# Patient Record
Sex: Female | Born: 1955
Health system: Southern US, Community
[De-identification: ages and names within clinical notes are randomized; demographics above are authoritative.]

## PROBLEM LIST (undated history)

## (undated) DIAGNOSIS — M1990 Unspecified osteoarthritis, unspecified site: Secondary | ICD-10-CM

## (undated) DIAGNOSIS — R7303 Prediabetes: Secondary | ICD-10-CM

## (undated) DIAGNOSIS — I10 Essential (primary) hypertension: Secondary | ICD-10-CM

## (undated) DIAGNOSIS — K219 Gastro-esophageal reflux disease without esophagitis: Secondary | ICD-10-CM

## (undated) DIAGNOSIS — L309 Dermatitis, unspecified: Secondary | ICD-10-CM

## (undated) DIAGNOSIS — E785 Hyperlipidemia, unspecified: Secondary | ICD-10-CM

## (undated) HISTORY — DX: Essential (primary) hypertension: I10

## (undated) HISTORY — DX: Dermatitis, unspecified: L30.9

## (undated) HISTORY — PX: APPENDECTOMY: SHX54

## (undated) HISTORY — DX: Gastro-esophageal reflux disease without esophagitis: K21.9

## (undated) HISTORY — PX: DILATION AND CURETTAGE OF UTERUS: SHX78

## (undated) HISTORY — DX: Hyperlipidemia, unspecified: E78.5

---

## 1999-08-12 HISTORY — PX: MYOMECTOMY: SHX85

## 2016-11-20 DIAGNOSIS — Z0000 Encounter for general adult medical examination without abnormal findings: Secondary | ICD-10-CM | POA: Diagnosis not present

## 2016-11-26 DIAGNOSIS — E7800 Pure hypercholesterolemia, unspecified: Secondary | ICD-10-CM | POA: Diagnosis not present

## 2016-11-26 DIAGNOSIS — L2089 Other atopic dermatitis: Secondary | ICD-10-CM | POA: Insufficient documentation

## 2016-11-26 DIAGNOSIS — E785 Hyperlipidemia, unspecified: Secondary | ICD-10-CM | POA: Insufficient documentation

## 2016-11-26 DIAGNOSIS — E1169 Type 2 diabetes mellitus with other specified complication: Secondary | ICD-10-CM | POA: Insufficient documentation

## 2016-11-26 DIAGNOSIS — E119 Type 2 diabetes mellitus without complications: Secondary | ICD-10-CM | POA: Diagnosis not present

## 2016-11-26 DIAGNOSIS — D509 Iron deficiency anemia, unspecified: Secondary | ICD-10-CM | POA: Diagnosis not present

## 2016-12-02 ENCOUNTER — Other Ambulatory Visit: Payer: Self-pay | Admitting: Family Medicine

## 2016-12-02 DIAGNOSIS — Z1231 Encounter for screening mammogram for malignant neoplasm of breast: Secondary | ICD-10-CM

## 2016-12-17 ENCOUNTER — Ambulatory Visit
Admission: RE | Admit: 2016-12-17 | Discharge: 2016-12-17 | Disposition: A | Discharge status: Home / Self Care | Payer: 59 | Source: Ambulatory Visit | Attending: Family Medicine | Admitting: Family Medicine

## 2016-12-17 ENCOUNTER — Encounter (HOSPITAL_COMMUNITY): Payer: Self-pay

## 2016-12-17 DIAGNOSIS — Z1231 Encounter for screening mammogram for malignant neoplasm of breast: Secondary | ICD-10-CM | POA: Diagnosis not present

## 2017-04-29 DIAGNOSIS — E119 Type 2 diabetes mellitus without complications: Secondary | ICD-10-CM | POA: Diagnosis not present

## 2017-04-29 DIAGNOSIS — E7800 Pure hypercholesterolemia, unspecified: Secondary | ICD-10-CM | POA: Diagnosis not present

## 2017-04-29 DIAGNOSIS — D509 Iron deficiency anemia, unspecified: Secondary | ICD-10-CM | POA: Diagnosis not present

## 2017-05-06 DIAGNOSIS — E119 Type 2 diabetes mellitus without complications: Secondary | ICD-10-CM | POA: Diagnosis not present

## 2017-05-06 DIAGNOSIS — E7800 Pure hypercholesterolemia, unspecified: Secondary | ICD-10-CM | POA: Diagnosis not present

## 2017-11-30 ENCOUNTER — Other Ambulatory Visit: Payer: Self-pay | Admitting: Family Medicine

## 2017-11-30 DIAGNOSIS — Z1231 Encounter for screening mammogram for malignant neoplasm of breast: Secondary | ICD-10-CM

## 2017-12-30 DIAGNOSIS — E119 Type 2 diabetes mellitus without complications: Secondary | ICD-10-CM | POA: Diagnosis not present

## 2017-12-30 DIAGNOSIS — E7800 Pure hypercholesterolemia, unspecified: Secondary | ICD-10-CM | POA: Diagnosis not present

## 2017-12-30 DIAGNOSIS — D508 Other iron deficiency anemias: Secondary | ICD-10-CM | POA: Diagnosis not present

## 2018-01-06 DIAGNOSIS — K219 Gastro-esophageal reflux disease without esophagitis: Secondary | ICD-10-CM | POA: Insufficient documentation

## 2018-01-06 DIAGNOSIS — I10 Essential (primary) hypertension: Secondary | ICD-10-CM | POA: Insufficient documentation

## 2018-01-06 DIAGNOSIS — E119 Type 2 diabetes mellitus without complications: Secondary | ICD-10-CM | POA: Diagnosis not present

## 2018-01-06 DIAGNOSIS — D508 Other iron deficiency anemias: Secondary | ICD-10-CM | POA: Diagnosis not present

## 2018-02-09 ENCOUNTER — Ambulatory Visit
Admission: RE | Admit: 2018-02-09 | Discharge: 2018-02-09 | Disposition: A | Discharge status: Home / Self Care | Payer: 59 | Source: Ambulatory Visit | Attending: Family Medicine | Admitting: Family Medicine

## 2018-02-09 DIAGNOSIS — Z1231 Encounter for screening mammogram for malignant neoplasm of breast: Secondary | ICD-10-CM | POA: Insufficient documentation

## 2018-09-02 DIAGNOSIS — E7800 Pure hypercholesterolemia, unspecified: Secondary | ICD-10-CM | POA: Diagnosis not present

## 2018-09-02 DIAGNOSIS — D508 Other iron deficiency anemias: Secondary | ICD-10-CM | POA: Diagnosis not present

## 2018-09-02 DIAGNOSIS — E119 Type 2 diabetes mellitus without complications: Secondary | ICD-10-CM | POA: Diagnosis not present

## 2018-09-02 DIAGNOSIS — I10 Essential (primary) hypertension: Secondary | ICD-10-CM | POA: Diagnosis not present

## 2018-09-10 DIAGNOSIS — Z0000 Encounter for general adult medical examination without abnormal findings: Secondary | ICD-10-CM | POA: Diagnosis not present

## 2018-09-10 DIAGNOSIS — D508 Other iron deficiency anemias: Secondary | ICD-10-CM | POA: Diagnosis not present

## 2018-09-10 DIAGNOSIS — E1169 Type 2 diabetes mellitus with other specified complication: Secondary | ICD-10-CM | POA: Diagnosis not present

## 2018-09-20 ENCOUNTER — Other Ambulatory Visit: Payer: Self-pay | Admitting: Obstetrics and Gynecology

## 2018-09-20 DIAGNOSIS — Z124 Encounter for screening for malignant neoplasm of cervix: Secondary | ICD-10-CM | POA: Diagnosis not present

## 2018-09-20 DIAGNOSIS — Z01419 Encounter for gynecological examination (general) (routine) without abnormal findings: Secondary | ICD-10-CM | POA: Diagnosis not present

## 2018-09-20 DIAGNOSIS — Z1231 Encounter for screening mammogram for malignant neoplasm of breast: Secondary | ICD-10-CM

## 2018-09-20 DIAGNOSIS — Z1211 Encounter for screening for malignant neoplasm of colon: Secondary | ICD-10-CM | POA: Diagnosis not present

## 2018-09-27 DIAGNOSIS — Z1211 Encounter for screening for malignant neoplasm of colon: Secondary | ICD-10-CM | POA: Diagnosis not present

## 2018-10-05 DIAGNOSIS — K219 Gastro-esophageal reflux disease without esophagitis: Secondary | ICD-10-CM | POA: Diagnosis not present

## 2018-10-05 DIAGNOSIS — K921 Melena: Secondary | ICD-10-CM | POA: Diagnosis not present

## 2018-10-05 DIAGNOSIS — Z1211 Encounter for screening for malignant neoplasm of colon: Secondary | ICD-10-CM | POA: Diagnosis not present

## 2019-01-18 ENCOUNTER — Other Ambulatory Visit: Payer: Self-pay | Admitting: Surgery

## 2019-01-18 DIAGNOSIS — N611 Abscess of the breast and nipple: Secondary | ICD-10-CM

## 2019-01-18 DIAGNOSIS — Z1231 Encounter for screening mammogram for malignant neoplasm of breast: Secondary | ICD-10-CM

## 2019-01-20 ENCOUNTER — Ambulatory Visit
Admission: RE | Admit: 2019-01-20 | Discharge: 2019-01-20 | Disposition: A | Discharge status: Home / Self Care | Payer: 59 | Source: Ambulatory Visit | Attending: Surgery | Admitting: Surgery

## 2019-01-20 ENCOUNTER — Other Ambulatory Visit: Payer: Self-pay

## 2019-01-20 DIAGNOSIS — N611 Abscess of the breast and nipple: Secondary | ICD-10-CM

## 2019-01-20 DIAGNOSIS — Z1231 Encounter for screening mammogram for malignant neoplasm of breast: Secondary | ICD-10-CM | POA: Insufficient documentation

## 2019-01-24 ENCOUNTER — Other Ambulatory Visit: Payer: Self-pay | Admitting: Surgery

## 2019-01-24 DIAGNOSIS — N61 Mastitis without abscess: Secondary | ICD-10-CM

## 2019-02-15 ENCOUNTER — Ambulatory Visit
Admission: RE | Admit: 2019-02-15 | Discharge: 2019-02-15 | Disposition: A | Discharge status: Home / Self Care | Payer: 59 | Source: Ambulatory Visit | Attending: Surgery | Admitting: Surgery

## 2019-02-15 DIAGNOSIS — N61 Mastitis without abscess: Secondary | ICD-10-CM | POA: Diagnosis present

## 2019-09-12 ENCOUNTER — Other Ambulatory Visit: Payer: Self-pay | Admitting: Internal Medicine

## 2019-09-12 DIAGNOSIS — Z86018 Personal history of other benign neoplasm: Secondary | ICD-10-CM

## 2019-09-21 ENCOUNTER — Ambulatory Visit: Payer: 59

## 2019-09-30 ENCOUNTER — Other Ambulatory Visit: Payer: Self-pay

## 2019-09-30 ENCOUNTER — Ambulatory Visit
Admission: RE | Admit: 2019-09-30 | Discharge: 2019-09-30 | Disposition: A | Discharge status: Home / Self Care | Payer: 59 | Source: Ambulatory Visit | Attending: Internal Medicine | Admitting: Internal Medicine

## 2019-09-30 DIAGNOSIS — Z86018 Personal history of other benign neoplasm: Secondary | ICD-10-CM | POA: Diagnosis not present

## 2019-09-30 LAB — POCT I-STAT CREATININE: Creatinine, Ser: 0.8 mg/dL (ref 0.44–1.00)

## 2019-09-30 MED ORDER — IOHEXOL 300 MG/ML  SOLN
100.0000 mL | Freq: Once | INTRAMUSCULAR | Status: AC | PRN
  Administered 2019-09-30: 100 mL via INTRAVENOUS

## 2019-10-06 DIAGNOSIS — D175 Benign lipomatous neoplasm of intra-abdominal organs: Secondary | ICD-10-CM | POA: Insufficient documentation

## 2020-03-19 ENCOUNTER — Other Ambulatory Visit: Payer: Self-pay | Admitting: Family Medicine

## 2020-03-19 DIAGNOSIS — Z1231 Encounter for screening mammogram for malignant neoplasm of breast: Secondary | ICD-10-CM

## 2020-04-10 ENCOUNTER — Other Ambulatory Visit: Payer: Self-pay

## 2020-04-10 ENCOUNTER — Ambulatory Visit
Admission: RE | Admit: 2020-04-10 | Discharge: 2020-04-10 | Disposition: A | Discharge status: Home / Self Care | Payer: 59 | Source: Ambulatory Visit | Attending: Family Medicine | Admitting: Family Medicine

## 2020-04-10 DIAGNOSIS — Z1231 Encounter for screening mammogram for malignant neoplasm of breast: Secondary | ICD-10-CM

## 2020-04-12 ENCOUNTER — Other Ambulatory Visit: Payer: Self-pay | Admitting: Family Medicine

## 2020-04-12 DIAGNOSIS — R928 Other abnormal and inconclusive findings on diagnostic imaging of breast: Secondary | ICD-10-CM

## 2020-04-12 DIAGNOSIS — N6310 Unspecified lump in the right breast, unspecified quadrant: Secondary | ICD-10-CM

## 2020-04-27 ENCOUNTER — Ambulatory Visit
Admission: RE | Admit: 2020-04-27 | Discharge: 2020-04-27 | Disposition: A | Discharge status: Home / Self Care | Payer: 59 | Source: Ambulatory Visit | Attending: Family Medicine | Admitting: Family Medicine

## 2020-04-27 DIAGNOSIS — N6310 Unspecified lump in the right breast, unspecified quadrant: Secondary | ICD-10-CM | POA: Diagnosis present

## 2020-04-27 DIAGNOSIS — R928 Other abnormal and inconclusive findings on diagnostic imaging of breast: Secondary | ICD-10-CM | POA: Diagnosis not present

## 2020-05-02 ENCOUNTER — Other Ambulatory Visit: Payer: Self-pay | Admitting: Family Medicine

## 2020-05-02 DIAGNOSIS — R928 Other abnormal and inconclusive findings on diagnostic imaging of breast: Secondary | ICD-10-CM

## 2020-05-02 DIAGNOSIS — N6310 Unspecified lump in the right breast, unspecified quadrant: Secondary | ICD-10-CM

## 2020-05-04 ENCOUNTER — Other Ambulatory Visit: Payer: Self-pay

## 2020-05-04 ENCOUNTER — Ambulatory Visit
Admission: RE | Admit: 2020-05-04 | Discharge: 2020-05-04 | Disposition: A | Discharge status: Home / Self Care | Payer: 59 | Source: Ambulatory Visit | Attending: Family Medicine | Admitting: Family Medicine

## 2020-05-04 DIAGNOSIS — R928 Other abnormal and inconclusive findings on diagnostic imaging of breast: Secondary | ICD-10-CM | POA: Diagnosis not present

## 2020-05-04 DIAGNOSIS — N6310 Unspecified lump in the right breast, unspecified quadrant: Secondary | ICD-10-CM | POA: Diagnosis present

## 2020-05-08 ENCOUNTER — Telehealth: Payer: Self-pay

## 2020-05-08 HISTORY — PX: BREAST BIOPSY: SHX20

## 2020-05-08 LAB — SURGICAL PATHOLOGY

## 2020-05-08 NOTE — Telephone Encounter (Signed)
Patient has an appt with Dr. Hampton Abbot on Oct 15 at 11:30.  Patient is aware of time/date.

## 2020-05-25 ENCOUNTER — Telehealth: Payer: Self-pay

## 2020-05-25 ENCOUNTER — Encounter: Payer: Self-pay | Admitting: Surgery

## 2020-05-25 ENCOUNTER — Ambulatory Visit (INDEPENDENT_AMBULATORY_CARE_PROVIDER_SITE_OTHER): Payer: 59 | Admitting: Surgery

## 2020-05-25 ENCOUNTER — Other Ambulatory Visit: Payer: Self-pay

## 2020-05-25 DIAGNOSIS — Z8619 Personal history of other infectious and parasitic diseases: Secondary | ICD-10-CM | POA: Insufficient documentation

## 2020-05-25 DIAGNOSIS — D241 Benign neoplasm of right breast: Secondary | ICD-10-CM

## 2020-05-25 NOTE — Patient Instructions (Addendum)
We have spoken today about removing a lump in your breast. This will be done by Dr. Hampton Abbot at [facility].  See your blue surgery sheet. Our surgery scheduler will call you to look at surgery date and go over information.    You will most likely be able to leave the hospital several hours after your surgery. Rarely, a patient needs to stay over night but this is a possibility. You will need a driver to go home from surgery.   Plan to tenatively be off work for 1-2 weeks following the surgery and may return with approximately 4 more weeks of a lifting restriction, no greater than 15 lbs on the affected side.  We will send for Medical Clearance.     Lumpectomy A lumpectomy is a form of "breast conserving" or "breast preservation" surgery. It may also be referred to as a partial mastectomy. During a lumpectomy, the portion of the breast that contains the cancerous tumor or breast mass (the lump) is removed. Some normal tissue around the lump may also be removed to make sure all of the tumor has been removed.  LET [facility] CARE PROVIDER KNOW ABOUT:  Any allergies you have.  All medicines you are taking, including vitamins, herbs, eye drops, creams, and over-the-counter medicines.  Previous problems you or members of your family have had with the use of anesthetics.  Any blood disorders you have.  Previous surgeries you have had.  Medical conditions you have. RISKS AND COMPLICATIONS Generally, this is a safe procedure. However, problems can occur and include:  Bleeding.  Infection.  Pain.  Temporary swelling.  Change in the shape of the breast, particularly if a large portion is removed. BEFORE THE PROCEDURE  Ask your health care provider about changing or stopping your regular medicines. This is especially important if you are taking diabetes medicines or blood thinners.  Do not eat or drink anything after midnight on the night before the procedure or as directed by your health  care provider. Ask your health care provider if you can take a sip of water with any approved medicines.  On the day of surgery, your health care provider will use a mammogram or ultrasound to locate and mark the tumor in your breast. These markings on your breast will show where the cut (incision) will be made. PROCEDURE   An IV tube will be put into one of your veins.  You may be given medicine to help you relax before the surgery (sedative). You will be given one of the following:  A medicine that numbs the area (local anesthetic).  A medicine that makes you fall asleep (general anesthetic).  Your health care provider will use a kind of electric scalpel that uses heat to minimize bleeding (electrocautery knife).  A curved incision (like a smile or frown) that follows the natural curve of your breast is made, to allow for minimal scarring and better healing.  The tumor will be removed with some of the surrounding tissue. This will be sent to the lab for analysis. Your health care provider may also remove your lymph nodes at this time if needed.  Sometimes, but not always, a rubber tube called a drain will be surgically inserted into your breast area or armpit to collect excess fluid that may accumulate in the space where the tumor was. This drain is connected to a plastic bulb on the outside of your body. This drain creates suction to help remove the fluid.  The incisions will  be closed with stitches (sutures).  A bandage may be placed over the incisions. AFTER THE PROCEDURE  You will be taken to the recovery area.  You will be given medicine for pain.  A small rubber drain may be placed in the breast for 2-3 days to prevent a collection of blood (hematoma) from developing in the breast. You will be given instructions on caring for the drain before you go home.  A pressure bandage (dressing) will be applied for 1-2 days to prevent bleeding. Ask your health care provider how to care  for your bandage at home.   This information is not intended to replace advice given to you by your health care provider. Make sure you discuss any questions you have with your health care provider.   Document Released: 09/08/2006 Document Revised: 08/18/2014 Document Reviewed: 12/31/2012 Elsevier Interactive Patient Education [employer].

## 2020-05-25 NOTE — Telephone Encounter (Signed)
Medical Clearance faxed to [facility] at this time.

## 2020-05-25 NOTE — H&P (View-Only) (Signed)
05/25/2020  Reason for Visit:  Right breast intraductal papilloma  Referring Provider:  [place]  History of Present Illness: Jasmine Little is a 64 y.o. female presenting for evaluation of a right breast intraductal papilloma.  This was found on screening mammogram on 04/10/20.  She has a history of prior right breast mastitis, treated with antibiotics.  She had a diagnostic mammogram on 9/17 followed by biopsy on 9/24.  I have viewed the patient's images and agree with the findings.  Her biopsy results showed fragments of an intraductal papilloma without any evidence of atypical proliferation or malignancy.  She reports having some mild bruising after the biopsy, but is currently resolved and doing well.  Denies any palpable masses, skin changes, or nipple changes or drainage.    She started menses at age 64, had one pregnancy at age 63 which resulted in miscarriage, and had menopause at age 53.  There is no family history of breast cancer.  Past Medical History: Past Medical History:  Diagnosis Date  . Dermatitis   . GERD (gastroesophageal reflux disease)   . Hyperlipidemia   . Hypertension      Past Surgical History: Past Surgical History:  Procedure Laterality Date  . APPENDECTOMY  age 64  . DILATION AND CURETTAGE OF UTERUS  age 67    Home Medications: Prior to Admission medications   Medication Sig Start Date End Date Taking? Authorizing Provider  hydrochlorothiazide (HYDRODIURIL) 12.5 MG tablet Take 12.5 mg by mouth daily. 03/19/20  Yes [provider]  ketoconazole (NIZORAL) 2 % cream Apply topically as needed. 04/17/20  Yes [provider]  lisinopril (ZESTRIL) 40 MG tablet Take 1 tablet by mouth daily. 05/23/20  Yes [provider]  lovastatin (MEVACOR) 40 MG tablet Take 1 tablet by mouth daily.   Yes [provider]  omeprazole (PRILOSEC) 20 MG capsule Take by mouth daily. 03/19/20 03/19/21 Yes [provider]   tacrolimus (PROTOPIC) 0.1 % ointment as needed. 09/27/19  Yes [provider]  triamcinolone cream (KENALOG) 0.5 % Apply topically 2 (two) times daily. 05/02/20  Yes [provider]  vitamin E (VITAMIN E) 180 MG (400 UNITS) capsule Take 400 Units by mouth daily.   Yes [provider]    Allergies: No Known Allergies  Social History:  reports that she quit smoking about 17 years ago. Her smoking use included cigarettes. She smoked 0.25 packs per day. She has never used smokeless tobacco. She reports current alcohol use. She reports that she does not use drugs.   Family History: Family History  Problem Relation Age of Onset  . Breast cancer Neg Hx     Review of Systems: Review of Systems  Constitutional: Negative for chills and fever.  HENT: Negative for hearing loss.   Eyes: Negative for blurred vision.  Respiratory: Negative for shortness of breath.   Cardiovascular: Negative for chest pain.  Gastrointestinal: Negative for abdominal pain, nausea and vomiting.  Genitourinary: Negative for dysuria.  Musculoskeletal: Negative for myalgias.  Skin: Negative for rash.  Neurological: Negative for dizziness.  Psychiatric/Behavioral: Negative for depression.    Physical Exam There were no vitals taken for this visit. CONSTITUTIONAL: No acute distress. HEENT:  Normocephalic, atraumatic, extraocular motion intact. NECK: Trachea is midline, and there is no jugular venous distension.  RESPIRATORY:  Lungs are clear, and breath sounds are equal bilaterally. Normal respiratory effort without pathologic use of accessory muscles. CARDIOVASCULAR: Heart is regular without murmurs, gallops, or rubs. BREAST:  Right  breast with biopsy scar just medial to the areola, healing well.  No palpable masses, skin changes, or nipple changes.  No right axillary or supraclavicular lymphadenopathy.  Left breast without any palpable masses, skin changes, or nipple changes.  No left  axillary or supraclavicular lymphadenopathy. GI: The abdomen is soft, obese, non-distended.  MUSCULOSKELETAL:  Normal muscle strength and tone in all four extremities.  No peripheral edema or cyanosis. SKIN: Skin turgor is normal. There are no pathologic skin lesions.  NEUROLOGIC:  Motor and sensation is grossly normal.  Cranial nerves are grossly intact. PSYCH:  Alert and oriented to person, place and time. Affect is normal.  Laboratory Analysis: Pathology: DIAGNOSIS:  A. BREAST, RIGHT RETROAREOLAR; ULTRASOUND-GUIDED CORE NEEDLE BIOPSY:  - FRAGMENTS OF BENIGN INTRADUCTAL PAPILLOMA WITH FOCAL DENSE SCLEROSIS.  - NO EVIDENCE OF ATYPICAL PROLIFERATIVE BREAST DISEASE.   Imaging: Mammogram and U/S 04/27/20: FINDINGS: Additional imaging demonstrates a tubular mass emanating from behind the right nipple with associated rounded masses, potentially representing a dilated duct with associated masses.  Mammographic images were processed with CAD.  Targeted ultrasound is performed, showing a dilated duct within the retroareolar right breast which bifurcates into two intraductal masses. The first mass measures approximately 1.3 x 0.8 x 0.8 cm and the second mass measures approximately 0.6 x 0.5 x 0.8 cm.  IMPRESSION: Intraductal masses within a dilated duct in the retroareolar right breast.   Assessment and Plan: This is a 64 y.o. female with a right breast intraductal papilloma.  --Discussed imaging findings with the patient and reviewed pathology results.  Overall there is no evidence of malignancy.  Although this is a benign finding, there is a small risk of upgrading diagnosis after resection, hence the recommendation of excision.  The patient is most definitely interested in surgery.  Discussed with her the role of radiofrequency tag placement in order to help with localization of the mass intraoperatively.  Then we would proceed with lumpectomy using the tag as a localizer.  Discussed  risks of bleeding, infection, injury to surrounding structures, and she's willing to proceed. She understands she would need COVID test prior to surgery.  We'll also send medical clearance to her PCP. --We'll schedule her for 06/07/20.  Face-to-face time spent with the patient and care providers was 60 minutes, with more than 50% of the time spent counseling, educating, and coordinating care of the patient.     Melvyn Neth, [facility]

## 2020-05-25 NOTE — Progress Notes (Signed)
05/25/2020  Reason for Visit:  Right breast intraductal papilloma  Referring Provider:  [place]  History of Present Illness: Jasmine Little is a 64 y.o. female presenting for evaluation of a right breast intraductal papilloma.  This was found on screening mammogram on 04/10/20.  She has a history of prior right breast mastitis, treated with antibiotics.  She had a diagnostic mammogram on 9/17 followed by biopsy on 9/24.  I have viewed the patient's images and agree with the findings.  Her biopsy results showed fragments of an intraductal papilloma without any evidence of atypical proliferation or malignancy.  She reports having some mild bruising after the biopsy, but is currently resolved and doing well.  Denies any palpable masses, skin changes, or nipple changes or drainage.    She started menses at age 78, had one pregnancy at age 27 which resulted in miscarriage, and had menopause at age 63.  There is no family history of breast cancer.  Past Medical History: Past Medical History:  Diagnosis Date  . Dermatitis   . GERD (gastroesophageal reflux disease)   . Hyperlipidemia   . Hypertension      Past Surgical History: Past Surgical History:  Procedure Laterality Date  . APPENDECTOMY  age 46  . DILATION AND CURETTAGE OF UTERUS  age 89    Home Medications: Prior to Admission medications   Medication Sig Start Date End Date Taking? Authorizing Provider  hydrochlorothiazide (HYDRODIURIL) 12.5 MG tablet Take 12.5 mg by mouth daily. 03/19/20  Yes [provider]  ketoconazole (NIZORAL) 2 % cream Apply topically as needed. 04/17/20  Yes [provider]  lisinopril (ZESTRIL) 40 MG tablet Take 1 tablet by mouth daily. 05/23/20  Yes [provider]  lovastatin (MEVACOR) 40 MG tablet Take 1 tablet by mouth daily.   Yes [provider]  omeprazole (PRILOSEC) 20 MG capsule Take by mouth daily. 03/19/20 03/19/21 Yes [provider]   tacrolimus (PROTOPIC) 0.1 % ointment as needed. 09/27/19  Yes [provider]  triamcinolone cream (KENALOG) 0.5 % Apply topically 2 (two) times daily. 05/02/20  Yes [provider]  vitamin E (VITAMIN E) 180 MG (400 UNITS) capsule Take 400 Units by mouth daily.   Yes [provider]    Allergies: No Known Allergies  Social History:  reports that she quit smoking about 17 years ago. Her smoking use included cigarettes. She smoked 0.25 packs per day. She has never used smokeless tobacco. She reports current alcohol use. She reports that she does not use drugs.   Family History: Family History  Problem Relation Age of Onset  . Breast cancer Neg Hx     Review of Systems: Review of Systems  Constitutional: Negative for chills and fever.  HENT: Negative for hearing loss.   Eyes: Negative for blurred vision.  Respiratory: Negative for shortness of breath.   Cardiovascular: Negative for chest pain.  Gastrointestinal: Negative for abdominal pain, nausea and vomiting.  Genitourinary: Negative for dysuria.  Musculoskeletal: Negative for myalgias.  Skin: Negative for rash.  Neurological: Negative for dizziness.  Psychiatric/Behavioral: Negative for depression.    Physical Exam There were no vitals taken for this visit. CONSTITUTIONAL: No acute distress. HEENT:  Normocephalic, atraumatic, extraocular motion intact. NECK: Trachea is midline, and there is no jugular venous distension.  RESPIRATORY:  Lungs are clear, and breath sounds are equal bilaterally. Normal respiratory effort without pathologic use of accessory muscles. CARDIOVASCULAR: Heart is regular without murmurs, gallops, or rubs. BREAST:  Right  breast with biopsy scar just medial to the areola, healing well.  No palpable masses, skin changes, or nipple changes.  No right axillary or supraclavicular lymphadenopathy.  Left breast without any palpable masses, skin changes, or nipple changes.  No left  axillary or supraclavicular lymphadenopathy. GI: The abdomen is soft, obese, non-distended.  MUSCULOSKELETAL:  Normal muscle strength and tone in all four extremities.  No peripheral edema or cyanosis. SKIN: Skin turgor is normal. There are no pathologic skin lesions.  NEUROLOGIC:  Motor and sensation is grossly normal.  Cranial nerves are grossly intact. PSYCH:  Alert and oriented to person, place and time. Affect is normal.  Laboratory Analysis: Pathology: DIAGNOSIS:  A. BREAST, RIGHT RETROAREOLAR; ULTRASOUND-GUIDED CORE NEEDLE BIOPSY:  - FRAGMENTS OF BENIGN INTRADUCTAL PAPILLOMA WITH FOCAL DENSE SCLEROSIS.  - NO EVIDENCE OF ATYPICAL PROLIFERATIVE BREAST DISEASE.   Imaging: Mammogram and U/S 04/27/20: FINDINGS: Additional imaging demonstrates a tubular mass emanating from behind the right nipple with associated rounded masses, potentially representing a dilated duct with associated masses.  Mammographic images were processed with CAD.  Targeted ultrasound is performed, showing a dilated duct within the retroareolar right breast which bifurcates into two intraductal masses. The first mass measures approximately 1.3 x 0.8 x 0.8 cm and the second mass measures approximately 0.6 x 0.5 x 0.8 cm.  IMPRESSION: Intraductal masses within a dilated duct in the retroareolar right breast.   Assessment and Plan: This is a 64 y.o. female with a right breast intraductal papilloma.  --Discussed imaging findings with the patient and reviewed pathology results.  Overall there is no evidence of malignancy.  Although this is a benign finding, there is a small risk of upgrading diagnosis after resection, hence the recommendation of excision.  The patient is most definitely interested in surgery.  Discussed with her the role of radiofrequency tag placement in order to help with localization of the mass intraoperatively.  Then we would proceed with lumpectomy using the tag as a localizer.  Discussed  risks of bleeding, infection, injury to surrounding structures, and she's willing to proceed. She understands she would need COVID test prior to surgery.  We'll also send medical clearance to her PCP. --We'll schedule her for 06/07/20.  Face-to-face time spent with the patient and care providers was 60 minutes, with more than 50% of the time spent counseling, educating, and coordinating care of the patient.     Melvyn Neth, [facility]

## 2020-05-28 ENCOUNTER — Other Ambulatory Visit: Payer: Self-pay | Admitting: Surgery

## 2020-05-28 DIAGNOSIS — D242 Benign neoplasm of left breast: Secondary | ICD-10-CM

## 2020-05-28 NOTE — Telephone Encounter (Signed)
Medical Clearance hand delivered to Dr.Linthavongs office due to faxing failed multiple times. Gave to April receptionist at front desk.

## 2020-05-30 ENCOUNTER — Telehealth: Payer: Self-pay | Admitting: Surgery

## 2020-05-30 NOTE — Telephone Encounter (Signed)
Patient has been advised of Pre-Admission date/time, COVID Testing date and Surgery date.  Surgery Date: 06/07/20 Preadmission Testing Date: 06/01/20 (phone 1p-5p) Covid Testing Date: 06/05/20 - patient advised to go to the [facility] ([place]) between 8a-1p  Patient has been made aware to call [phone], between 1-3:00pm the day before surgery, to find out what time to arrive for surgery.    Patient also has been informed by [facility] regarding her RF tag scheduled for 06/04/20.

## 2020-06-01 ENCOUNTER — Other Ambulatory Visit: Payer: Self-pay

## 2020-06-01 ENCOUNTER — Encounter
Admission: RE | Admit: 2020-06-01 | Discharge: 2020-06-01 | Disposition: A | Discharge status: Home / Self Care | Payer: 59 | Source: Ambulatory Visit | Attending: Surgery | Admitting: Surgery

## 2020-06-01 HISTORY — DX: Prediabetes: R73.03

## 2020-06-01 HISTORY — DX: Unspecified osteoarthritis, unspecified site: M19.90

## 2020-06-01 NOTE — Patient Instructions (Signed)
Your procedure is scheduled on: Thursday June 07, 2020. Report to Day Surgery inside [facility] 2nd floor . To find out your arrival time please call [phone] between 1PM - 3PM on Wednesday June 06, 2020.  Remember: Instructions that are not followed completely may result in serious medical risk,  up to and including death, or upon the discretion of your surgeon and anesthesiologist your  surgery may need to be rescheduled.     _X__ 1. Do not eat food after midnight the night before your procedure.                 No chewing gum or hard candies. You may drink clear liquids up to 2 hours                 before you are scheduled to arrive for your surgery- DO not drink clear                 liquids within 2 hours of the start of your surgery.                 Clear Liquids include:  water, apple juice without pulp, clear Gatorade, G2 or                  Gatorade Zero (avoid Red/Purple/Blue), Black Coffee or Tea (Do not add                 anything to coffee or tea).  __X__2.  On the morning of surgery brush your teeth with toothpaste and water, you                may rinse your mouth with mouthwash if you wish.  Do not swallow any toothpaste of mouthwash.     _X__ 3.  No Alcohol for 24 hours before or after surgery.   _X__ 4.  Do Not Smoke or use e-cigarettes For 24 Hours Prior to Your Surgery.                 Do not use any chewable tobacco products for at least 6 hours prior to                 Surgery.  _X__  5.  Do not use any recreational drugs (marijuana, cocaine, heroin, ecstasy, MDMA or other)                For at least one week prior to your surgery.  Combination of these drugs with anesthesia                May have life threatening results.  __X__ 6.  Notify your doctor if there is any change in your medical condition      (cold, fever, infections).     Do not wear jewelry, make-up, hairpins, clips or nail polish. Do not wear lotions,  powders, or perfumes. You may wear deodorant. Do not shave 48 hours prior to surgery. Men may shave face and neck. Do not bring valuables to the hospital.    [facility] is not responsible for any belongings or valuables.  Contacts, dentures or bridgework may not be worn into surgery. Leave your suitcase in the car. After surgery it may be brought to your room. For patients admitted to the hospital, discharge time is determined by your treatment team.   Patients discharged the day of surgery will not be allowed to drive home.   Make arrangements for someone to  be with you for the first 24 hours of your Same Day Discharge.   __x__ Take these medicines the morning of surgery with A SIP OF WATER:    1. omeprazole (PRILOSEC) 20 MG  __X__ Use CHG Soap (or wipes) as directed  ____ Use Benzoyl Peroxide Gel as instructed  ____ Use inhalers on the day of surgery  ____ Stop metformin 2 days prior to surgery    ____ Take 1/2 of usual insulin dose the night before surgery. No insulin the morning          of surgery.   ____ Stop Coumadin/Plavix/aspirin on   __x__ Stop Anti-inflammatories such as Ibuprofen, Aleve, Advil, naproxen, aspirin and or BC powders.    __x__ Stop supplements until after surgery.    __x__ Bring C-Pap to the hospital.    If you have any questions regarding your pre-procedure instructions,  Please call Pre-admit Testing at [phone].

## 2020-06-04 ENCOUNTER — Ambulatory Visit
Admission: RE | Admit: 2020-06-04 | Discharge: 2020-06-04 | Disposition: A | Discharge status: Home / Self Care | Payer: 59 | Source: Ambulatory Visit | Attending: Surgery | Admitting: Surgery

## 2020-06-04 ENCOUNTER — Other Ambulatory Visit: Payer: Self-pay

## 2020-06-04 DIAGNOSIS — D242 Benign neoplasm of left breast: Secondary | ICD-10-CM | POA: Insufficient documentation

## 2020-06-05 ENCOUNTER — Other Ambulatory Visit
Admission: RE | Admit: 2020-06-05 | Discharge: 2020-06-05 | Disposition: A | Discharge status: Home / Self Care | Payer: 59 | Source: Ambulatory Visit | Attending: Surgery | Admitting: Surgery

## 2020-06-05 DIAGNOSIS — Z20822 Contact with and (suspected) exposure to covid-19: Secondary | ICD-10-CM | POA: Insufficient documentation

## 2020-06-05 DIAGNOSIS — Z01812 Encounter for preprocedural laboratory examination: Secondary | ICD-10-CM | POA: Insufficient documentation

## 2020-06-05 LAB — SARS CORONAVIRUS 2 (TAT 6-24 HRS): SARS Coronavirus 2: NEGATIVE

## 2020-06-05 NOTE — Progress Notes (Signed)
Medical Clearance has been received from Dr Netty Starring. The patient is cleared at Low risk for surgery.

## 2020-06-07 ENCOUNTER — Ambulatory Visit: Payer: 59 | Admitting: Urgent Care

## 2020-06-07 ENCOUNTER — Other Ambulatory Visit: Payer: Self-pay

## 2020-06-07 ENCOUNTER — Ambulatory Visit
Admission: RE | Admit: 2020-06-07 | Discharge: 2020-06-07 | Disposition: A | Discharge status: Home / Self Care | Payer: 59 | Source: Ambulatory Visit | Attending: Surgery | Admitting: Surgery

## 2020-06-07 ENCOUNTER — Ambulatory Visit
Admission: RE | Admit: 2020-06-07 | Discharge: 2020-06-07 | Disposition: A | Discharge status: Home / Self Care | Payer: 59 | Attending: Surgery | Admitting: Surgery

## 2020-06-07 ENCOUNTER — Encounter
Admission: RE | Disposition: A | Discharge status: Home / Self Care | Payer: Self-pay | Source: Home / Self Care | Attending: Surgery

## 2020-06-07 ENCOUNTER — Encounter: Payer: Self-pay | Admitting: Surgery

## 2020-06-07 DIAGNOSIS — D241 Benign neoplasm of right breast: Secondary | ICD-10-CM

## 2020-06-07 DIAGNOSIS — D242 Benign neoplasm of left breast: Secondary | ICD-10-CM

## 2020-06-07 DIAGNOSIS — Z87891 Personal history of nicotine dependence: Secondary | ICD-10-CM | POA: Diagnosis not present

## 2020-06-07 DIAGNOSIS — N6041 Mammary duct ectasia of right breast: Secondary | ICD-10-CM | POA: Insufficient documentation

## 2020-06-07 HISTORY — PX: BREAST LUMPECTOMY WITH RADIOFREQUENCY TAG IDENTIFICATION: SHX6884

## 2020-06-07 LAB — URINE DRUG SCREEN, QUALITATIVE (ARMC ONLY)
Amphetamines, Ur Screen: NOT DETECTED
Barbiturates, Ur Screen: NOT DETECTED
Benzodiazepine, Ur Scrn: NOT DETECTED
Cannabinoid 50 Ng, Ur ~~LOC~~: POSITIVE — AB
Cocaine Metabolite,Ur ~~LOC~~: NOT DETECTED
MDMA (Ecstasy)Ur Screen: NOT DETECTED
Methadone Scn, Ur: NOT DETECTED
Opiate, Ur Screen: NOT DETECTED
Phencyclidine (PCP) Ur S: NOT DETECTED
Tricyclic, Ur Screen: NOT DETECTED

## 2020-06-07 LAB — CBC
HCT: 31.7 % — ABNORMAL LOW (ref 36.0–46.0)
Hemoglobin: 10.1 g/dL — ABNORMAL LOW (ref 12.0–15.0)
MCH: 24.9 pg — ABNORMAL LOW (ref 26.0–34.0)
MCHC: 31.9 g/dL (ref 30.0–36.0)
MCV: 78.1 fL — ABNORMAL LOW (ref 80.0–100.0)
Platelets: 326 10*3/uL (ref 150–400)
RBC: 4.06 MIL/uL (ref 3.87–5.11)
RDW: 19 % — ABNORMAL HIGH (ref 11.5–15.5)
WBC: 9 10*3/uL (ref 4.0–10.5)
nRBC: 0 % (ref 0.0–0.2)

## 2020-06-07 SURGERY — BREAST LUMPECTOMY WITH RADIOFREQUENCY TAG IDENTIFICATION
Anesthesia: General | Site: Breast | Laterality: Right

## 2020-06-07 MED ORDER — BUPIVACAINE-EPINEPHRINE 0.5% -1:200000 IJ SOLN
INTRAMUSCULAR | Status: DC | PRN
  Administered 2020-06-07: 30 mL

## 2020-06-07 MED ORDER — ORAL CARE MOUTH RINSE: 15.0000 mL | Freq: Once | OROMUCOSAL | Status: AC

## 2020-06-07 MED ORDER — ONDANSETRON HCL 4 MG/2ML IJ SOLN: 4.0000 mg | Freq: Once | INTRAMUSCULAR | Status: DC | PRN

## 2020-06-07 MED ORDER — FENTANYL CITRATE (PF) 100 MCG/2ML IJ SOLN
INTRAMUSCULAR | Status: AC
  Filled 2020-06-07: qty 2

## 2020-06-07 MED ORDER — DEXAMETHASONE SODIUM PHOSPHATE 10 MG/ML IJ SOLN
INTRAMUSCULAR | Status: AC
  Filled 2020-06-07: qty 1

## 2020-06-07 MED ORDER — EPINEPHRINE PF 1 MG/ML IJ SOLN
INTRAMUSCULAR | Status: AC
  Filled 2020-06-07: qty 1

## 2020-06-07 MED ORDER — LIDOCAINE HCL (CARDIAC) PF 100 MG/5ML IV SOSY
PREFILLED_SYRINGE | INTRAVENOUS | Status: DC | PRN
  Administered 2020-06-07: 100 mg via INTRAVENOUS

## 2020-06-07 MED ORDER — BUPIVACAINE HCL (PF) 0.5 % IJ SOLN
INTRAMUSCULAR | Status: AC
  Filled 2020-06-07: qty 30

## 2020-06-07 MED ORDER — IBUPROFEN 600 MG PO TABS: 600.0000 mg | ORAL_TABLET | Freq: Three times a day (TID) | ORAL | 0 refills | Status: DC | PRN

## 2020-06-07 MED ORDER — ROCURONIUM BROMIDE 100 MG/10ML IV SOLN
INTRAVENOUS | Status: DC | PRN
  Administered 2020-06-07: 20 mg via INTRAVENOUS
  Administered 2020-06-07: 10 mg via INTRAVENOUS
  Administered 2020-06-07: 40 mg via INTRAVENOUS

## 2020-06-07 MED ORDER — GABAPENTIN 300 MG PO CAPS: 300.0000 mg | ORAL_CAPSULE | ORAL | Status: AC

## 2020-06-07 MED ORDER — ACETAMINOPHEN 500 MG PO TABS: 1000.0000 mg | ORAL_TABLET | ORAL | Status: AC

## 2020-06-07 MED ORDER — CEFAZOLIN SODIUM-DEXTROSE 2-4 GM/100ML-% IV SOLN
2.0000 g | INTRAVENOUS | Status: AC
  Administered 2020-06-07: 2 g via INTRAVENOUS

## 2020-06-07 MED ORDER — LACTATED RINGERS IV SOLN: INTRAVENOUS | Status: DC

## 2020-06-07 MED ORDER — CHLORHEXIDINE GLUCONATE 0.12 % MT SOLN: 15.0000 mL | Freq: Once | OROMUCOSAL | Status: AC

## 2020-06-07 MED ORDER — ONDANSETRON HCL 4 MG/2ML IJ SOLN
INTRAMUSCULAR | Status: AC
  Filled 2020-06-07: qty 2

## 2020-06-07 MED ORDER — SODIUM CHLORIDE (PF) 0.9 % IJ SOLN
INTRAMUSCULAR | Status: AC
  Filled 2020-06-07: qty 10

## 2020-06-07 MED ORDER — ACETAMINOPHEN 500 MG PO TABS
ORAL_TABLET | ORAL | Status: AC
  Administered 2020-06-07: 1000 mg via ORAL
  Filled 2020-06-07: qty 2

## 2020-06-07 MED ORDER — METHYLENE BLUE 0.5 % INJ SOLN
INTRAVENOUS | Status: AC
  Filled 2020-06-07: qty 10

## 2020-06-07 MED ORDER — DEXAMETHASONE SODIUM PHOSPHATE 10 MG/ML IJ SOLN
INTRAMUSCULAR | Status: DC | PRN
  Administered 2020-06-07: 10 mg via INTRAVENOUS

## 2020-06-07 MED ORDER — SUGAMMADEX SODIUM 200 MG/2ML IV SOLN
INTRAVENOUS | Status: DC | PRN
  Administered 2020-06-07: 200 mg via INTRAVENOUS

## 2020-06-07 MED ORDER — FENTANYL CITRATE (PF) 100 MCG/2ML IJ SOLN: 25.0000 ug | INTRAMUSCULAR | Status: DC | PRN

## 2020-06-07 MED ORDER — MIDAZOLAM HCL 2 MG/2ML IJ SOLN
INTRAMUSCULAR | Status: DC | PRN
  Administered 2020-06-07: 2 mg via INTRAVENOUS

## 2020-06-07 MED ORDER — ROCURONIUM BROMIDE 10 MG/ML (PF) SYRINGE
PREFILLED_SYRINGE | INTRAVENOUS | Status: AC
  Filled 2020-06-07: qty 10

## 2020-06-07 MED ORDER — GABAPENTIN 300 MG PO CAPS
ORAL_CAPSULE | ORAL | Status: AC
  Administered 2020-06-07: 300 mg via ORAL
  Filled 2020-06-07: qty 1

## 2020-06-07 MED ORDER — PROPOFOL 10 MG/ML IV BOLUS
INTRAVENOUS | Status: AC
  Filled 2020-06-07: qty 20

## 2020-06-07 MED ORDER — CHLORHEXIDINE GLUCONATE CLOTH 2 % EX PADS
6.0000 | MEDICATED_PAD | Freq: Once | CUTANEOUS | Status: AC
  Administered 2020-06-07: 6 via TOPICAL

## 2020-06-07 MED ORDER — PROPOFOL 10 MG/ML IV BOLUS
INTRAVENOUS | Status: DC | PRN
  Administered 2020-06-07: 160 mg via INTRAVENOUS

## 2020-06-07 MED ORDER — CHLORHEXIDINE GLUCONATE 0.12 % MT SOLN
OROMUCOSAL | Status: AC
  Administered 2020-06-07: 15 mL via OROMUCOSAL
  Filled 2020-06-07: qty 15

## 2020-06-07 MED ORDER — LIDOCAINE HCL (PF) 2 % IJ SOLN
INTRAMUSCULAR | Status: AC
  Filled 2020-06-07: qty 5

## 2020-06-07 MED ORDER — FENTANYL CITRATE (PF) 250 MCG/5ML IJ SOLN
INTRAMUSCULAR | Status: DC | PRN
  Administered 2020-06-07 (×4): 50 ug via INTRAVENOUS

## 2020-06-07 MED ORDER — CEFAZOLIN SODIUM-DEXTROSE 2-4 GM/100ML-% IV SOLN
INTRAVENOUS | Status: AC
  Filled 2020-06-07: qty 100

## 2020-06-07 MED ORDER — ACETAMINOPHEN 500 MG PO TABS: 1000.0000 mg | ORAL_TABLET | Freq: Four times a day (QID) | ORAL | Status: DC | PRN

## 2020-06-07 MED ORDER — PHENYLEPHRINE HCL (PRESSORS) 10 MG/ML IV SOLN
INTRAVENOUS | Status: DC | PRN
  Administered 2020-06-07 (×7): 100 ug via INTRAVENOUS

## 2020-06-07 MED ORDER — SUCCINYLCHOLINE CHLORIDE 20 MG/ML IJ SOLN
INTRAMUSCULAR | Status: DC | PRN
  Administered 2020-06-07: 140 mg via INTRAVENOUS

## 2020-06-07 MED ORDER — MIDAZOLAM HCL 2 MG/2ML IJ SOLN
INTRAMUSCULAR | Status: AC
  Filled 2020-06-07: qty 2

## 2020-06-07 MED ORDER — ONDANSETRON HCL 4 MG/2ML IJ SOLN
INTRAMUSCULAR | Status: DC | PRN
  Administered 2020-06-07: 4 mg via INTRAVENOUS

## 2020-06-07 SURGICAL SUPPLY — 54 items
ADH SKN CLS APL DERMABOND .7 (GAUZE/BANDAGES/DRESSINGS) ×1
APL PRP STRL LF DISP 70% ISPRP (MISCELLANEOUS) ×1
APPLIER CLIP 9.375 SM OPEN (CLIP)
APR CLP SM 9.3 20 MLT OPN (CLIP)
BINDER BREAST LRG (GAUZE/BANDAGES/DRESSINGS) IMPLANT
BINDER BREAST MEDIUM (GAUZE/BANDAGES/DRESSINGS) IMPLANT
BINDER BREAST XXLRG (GAUZE/BANDAGES/DRESSINGS) ×2 IMPLANT
BLADE PHOTON ILLUMINATED (MISCELLANEOUS) ×2 IMPLANT
BLADE SURG 15 STRL LF DISP TIS (BLADE) ×2 IMPLANT
BLADE SURG 15 STRL SS (BLADE) ×4
CANISTER SUCT 1200ML W/VALVE (MISCELLANEOUS) ×2 IMPLANT
CHLORAPREP W/TINT 26 (MISCELLANEOUS) ×2 IMPLANT
CLIP APPLIE 9.375 SM OPEN (CLIP) IMPLANT
CNTNR SPEC 2.5X3XGRAD LEK (MISCELLANEOUS) ×1
CONT SPEC 4OZ STER OR WHT (MISCELLANEOUS) ×1
CONT SPEC 4OZ STRL OR WHT (MISCELLANEOUS) ×1
CONTAINER SPEC 2.5X3XGRAD LEK (MISCELLANEOUS) ×1 IMPLANT
COVER PROBE FLX POLY STRL (MISCELLANEOUS) ×6 IMPLANT
COVER WAND RF STERILE (DRAPES) IMPLANT
DERMABOND ADVANCED (GAUZE/BANDAGES/DRESSINGS) ×1
DERMABOND ADVANCED .7 DNX12 (GAUZE/BANDAGES/DRESSINGS) ×1 IMPLANT
DEVICE DUBIN SPECIMEN MAMMOGRA (MISCELLANEOUS) ×2 IMPLANT
DRAPE LAPAROTOMY 100X77 ABD (DRAPES) ×2 IMPLANT
DRSG GAUZE FLUFF 36X18 (GAUZE/BANDAGES/DRESSINGS) ×2 IMPLANT
ELECT CAUTERY BLADE 6.4 (BLADE) ×2 IMPLANT
ELECT REM PT RETURN 9FT ADLT (ELECTROSURGICAL) ×2
ELECTRODE REM PT RTRN 9FT ADLT (ELECTROSURGICAL) ×1 IMPLANT
GLOVE SURG SYN 7.0 (GLOVE) ×2 IMPLANT
GLOVE SURG SYN 7.5  E (GLOVE) ×1
GLOVE SURG SYN 7.5 E (GLOVE) ×1 IMPLANT
GOWN STRL REUS W/ TWL LRG LVL3 (GOWN DISPOSABLE) ×2 IMPLANT
GOWN STRL REUS W/TWL LRG LVL3 (GOWN DISPOSABLE) ×4
KIT MARKER MARGIN INK (KITS) IMPLANT
KIT TURNOVER KIT A (KITS) ×2 IMPLANT
LABEL OR SOLS (LABEL) ×2 IMPLANT
MARGIN MAP 10MM (MISCELLANEOUS) IMPLANT
MARKER MARGIN CORRECT CLIP (MARKER) ×2 IMPLANT
NEEDLE HYPO 22GX1.5 SAFETY (NEEDLE) ×2 IMPLANT
NEEDLE HYPO 25X1 1.5 SAFETY (NEEDLE) ×2 IMPLANT
PACK BASIN MINOR (MISCELLANEOUS) ×2 IMPLANT
SET LOCALIZER 20 PROBE US (MISCELLANEOUS) ×2 IMPLANT
SLEVE PROBE SENORX GAMMA FIND (MISCELLANEOUS) IMPLANT
SUT ETHILON 3-0 FS-10 30 BLK (SUTURE)
SUT MNCRL 4-0 (SUTURE) ×2
SUT MNCRL 4-0 27XMFL (SUTURE) ×1
SUT SILK 3 0 SH 30 (SUTURE) ×2 IMPLANT
SUT VIC AB 3-0 SH 27 (SUTURE) ×2
SUT VIC AB 3-0 SH 27X BRD (SUTURE) ×1 IMPLANT
SUTURE EHLN 3-0 FS-10 30 BLK (SUTURE) IMPLANT
SUTURE MNCRL 4-0 27XMF (SUTURE) ×1 IMPLANT
SYR 10ML LL (SYRINGE) ×2 IMPLANT
SYR BULB IRRIG 60ML STRL (SYRINGE) ×2 IMPLANT
TAPE TRANSPORE STRL 2 31045 (GAUZE/BANDAGES/DRESSINGS) IMPLANT
WATER STERILE IRR 1000ML POUR (IV SOLUTION) ×2 IMPLANT

## 2020-06-07 NOTE — Transfer of Care (Signed)
Immediate Anesthesia Transfer of Care Note  Patient: Jasmine Little  Procedure(s) Performed: BREAST LUMPECTOMY WITH RADIOFREQUENCY TAG IDENTIFICATION (Right Breast)  Patient Location: PACU  Anesthesia Type:General  Level of Consciousness: awake, alert , oriented and patient cooperative  Airway & Oxygen Therapy: Patient Spontanous Breathing and Patient connected to face mask oxygen  Post-op Assessment: Report given to RN and Post -op Vital signs reviewed and stable  Post vital signs: Reviewed and stable  Last Vitals:  Vitals Value Taken Time  BP 151/106 06/07/20 0935  Temp    Pulse 70 06/07/20 0935  Resp 14 06/07/20 0935  SpO2 100 % 06/07/20 0935  Vitals shown include unvalidated device data.  Last Pain:  Vitals:   06/07/20 0933  TempSrc:   PainSc: (P) 0-No pain         Complications: No complications documented.

## 2020-06-07 NOTE — Anesthesia Procedure Notes (Signed)
Procedure Name: Intubation Date/Time: 06/07/2020 7:43 AM Performed by: Eben Burow, CRNA Pre-anesthesia Checklist: Patient identified, Emergency Drugs available, Suction available and Patient being monitored Patient Re-evaluated:Patient Re-evaluated prior to induction Oxygen Delivery Method: Circle system utilized Preoxygenation: Pre-oxygenation with 100% oxygen Induction Type: IV induction Ventilation: Mask ventilation without difficulty Laryngoscope Size: McGraph and 3 Grade View: Grade I Tube type: Oral Tube size: 7.5 mm Number of attempts: 1 Airway Equipment and Method: Stylet,  Oral airway and Video-laryngoscopy Placement Confirmation: ETT inserted through vocal cords under direct vision,  positive ETCO2 and breath sounds checked- equal and bilateral Secured at: 23 cm Tube secured with: Tape Dental Injury: Teeth and Oropharynx as per pre-operative assessment  Difficulty Due To: Difficulty was anticipated and Difficult Airway- due to anterior larynx

## 2020-06-07 NOTE — Discharge Instructions (Signed)

## 2020-06-07 NOTE — Op Note (Signed)
  Procedure Date:  06/07/2020  Pre-operative Diagnosis:  Right breast intraductal papilloma  Post-operative Diagnosis:  Right breast intraductal papilloma  Procedure:  Right breast radiofrequency tag localized lumpectomy   Surgeon:  Melvyn Neth, MD  Anesthesia:  General endotracheal  Estimated Blood Loss:  10 ml  Specimens:  Right breast mass  Complications:  None  Indications for Procedure:  This is a 64 y.o. female who presents with a right breast intraductal papilloma and wishes to have this excised.  The risks of bleeding, infection, injury to surrounding structures, hematoma, seroma, open wound, cosmetic deformity, and the need for further surgery were all discussed with the patient and was willing to proceed.  Prior to this procedure, the patient had undergone RF tag placement.  Description of Procedure: The patient was correctly identified in the preoperative area and brought into the operating room.  The patient was placed supine with VTE prophylaxis in place.  Appropriate time-outs were performed.  Anesthesia was induced and the patient was intubated.  Appropriate antibiotics were infused.  The Hologic box was used to help localize the RF tag and this was medial to the areola.  The right breast was prepped and draped in usual sterile fashion.  A curvilinear incision was made bordering the medial aspect of the right areola.  Cautery was used to dissect down to the breast tissue.  Then using the Hologic probe, the RF tag was better localized and a partial mastectomy was performed with adequate margins.  The specimen was painted and clipped to mark each of its sides.  The specimen was imaged showing that the RF tag and biopsy clip were within the specimen.  The cavity was irrigated and hemostasis was assured with electrocautery.  Local anesthetic was infiltrated into the skin and subcutaneous tissue of the cavity.  The wound was then closed in two layers with 3-0 Vicryl and 4-0  Monocryl and sealed with DermaBond.  The patient was emerged from anesthesia and extubated and brought to the recovery room for further management.  The patient tolerated the procedure well and all counts were correct at the end of the case.   Melvyn Neth, MD

## 2020-06-07 NOTE — Anesthesia Postprocedure Evaluation (Signed)
Anesthesia Post Note  Patient: Jasmine Little  Procedure(s) Performed: BREAST LUMPECTOMY WITH RADIOFREQUENCY TAG IDENTIFICATION (Right Breast)  Patient location during evaluation: PACU Anesthesia Type: General Level of consciousness: awake and alert Pain management: pain level controlled Vital Signs Assessment: post-procedure vital signs reviewed and stable Respiratory status: spontaneous breathing and respiratory function stable Cardiovascular status: stable Anesthetic complications: no   No complications documented.   Last Vitals:  Vitals:   06/07/20 1009 06/07/20 1027  BP:  (!) 139/48  Pulse: 73 73  Resp:  16  Temp: (!) 36.3 C (!) 36.4 C  SpO2: 99% 100%    Last Pain:  Vitals:   06/07/20 1027  TempSrc: Temporal  PainSc: 0-No pain                 Aissa Lisowski K

## 2020-06-07 NOTE — Anesthesia Preprocedure Evaluation (Signed)
Anesthesia Evaluation  Patient identified by MRN, date of birth, ID band Patient awake    Reviewed: Allergy & Precautions, NPO status , Patient's Chart, lab work & pertinent test results  History of Anesthesia Complications Negative for: history of anesthetic complications  Airway Mallampati: III       Dental   Pulmonary neg sleep apnea, neg COPD, Not current smoker, former smoker,           Cardiovascular hypertension, Pt. on medications (-) Past MI and (-) CHF (-) dysrhythmias (-) Valvular Problems/Murmurs     Neuro/Psych neg Seizures    GI/Hepatic Neg liver ROS, GERD  Medicated and Controlled,  Endo/Other  diabetes (borderline, diet controlled)Morbid obesity  Renal/GU negative Renal ROS     Musculoskeletal   Abdominal   Peds  Hematology   Anesthesia Other Findings   Reproductive/Obstetrics                             Anesthesia Physical Anesthesia Plan  ASA: III  Anesthesia Plan: General   Post-op Pain Management:    Induction: Intravenous  PONV Risk Score and Plan: 3 and Ondansetron, Dexamethasone and Midazolam  Airway Management Planned: Oral ETT  Additional Equipment:   Intra-op Plan:   Post-operative Plan:   Informed Consent: I have reviewed the patients History and Physical, chart, labs and discussed the procedure including the risks, benefits and alternatives for the proposed anesthesia with the patient or authorized representative who has indicated his/her understanding and acceptance.       Plan Discussed with:   Anesthesia Plan Comments:         Anesthesia Quick Evaluation

## 2020-06-07 NOTE — Interval H&P Note (Signed)
History and Physical Interval Note:  06/07/2020 7:24 AM  Jasmine Little  has presented today for surgery, with the diagnosis of Right breast intraductal papilloma.  The various methods of treatment have been discussed with the patient and family. After consideration of risks, benefits and other options for treatment, the patient has consented to  Procedure(s): BREAST LUMPECTOMY WITH RADIOFREQUENCY TAG IDENTIFICATION (Right) as a surgical intervention.  The patient's history has been reviewed, patient examined, no change in status, stable for surgery.  I have reviewed the patient's chart and labs.  Questions were answered to the patient's satisfaction.     Stonewall Doss

## 2020-06-08 ENCOUNTER — Encounter: Payer: Self-pay | Admitting: Surgery

## 2020-06-08 LAB — SURGICAL PATHOLOGY

## 2020-06-22 ENCOUNTER — Encounter: Payer: Self-pay | Admitting: Surgery

## 2020-06-22 ENCOUNTER — Ambulatory Visit (INDEPENDENT_AMBULATORY_CARE_PROVIDER_SITE_OTHER): Payer: 59 | Admitting: Surgery

## 2020-06-22 ENCOUNTER — Other Ambulatory Visit: Payer: Self-pay

## 2020-06-22 VITALS — BP 181/61 | HR 70 | Temp 98.5°F | Ht 61.5 in | Wt 230.0 lb

## 2020-06-22 DIAGNOSIS — D241 Benign neoplasm of right breast: Secondary | ICD-10-CM

## 2020-06-22 DIAGNOSIS — Z09 Encounter for follow-up examination after completed treatment for conditions other than malignant neoplasm: Secondary | ICD-10-CM

## 2020-06-22 NOTE — Patient Instructions (Addendum)
Dr.Piscoya discussed with patient she may resume with normal nightly routine of no more compression to area. Patient advised to watch out for fullness or swelling in the area of the breast. Dr.Piscoya discussed with patient that once the residue surgical glue falls off, patient may use moisture to the breast. Patient advised to continue to have repeat yearly mammograms. Patient will follow up with Dr.Piscoya in 1 year.  Lumpectomy, Care After This sheet gives you information about how to care for yourself after your procedure. Your health care provider may also give you more specific instructions. If you have problems or questions, contact your health care provider. What can I expect after the procedure? After the procedure, it is common to have:  Breast swelling.  Breast tenderness.  Stiffness in your arm or shoulder.  A change in the shape and feel of your breast.  Scar tissue that feels hard to the touch in the area where the lump was removed. Follow these instructions at home: Medicines  Take over-the-counter and prescription medicines only as told by your health care provider.  If you were prescribed an antibiotic medicine, take it as told by your health care provider. Do not stop taking the antibiotic even if you start to feel better.  Ask your health care provider if the medicine prescribed to you: ? Requires you to avoid driving or using heavy machinery. ? Can cause constipation. You may need to take these actions to prevent or treat constipation:  Drink enough fluid to keep your urine pale yellow.  Take over-the-counter or prescription medicines.  Eat foods that are high in fiber, such as beans, whole grains, and fresh fruits and vegetables.  Limit foods that are high in fat and processed sugars, such as fried or sweet foods. Incision care      Follow instructions from your health care provider about how to take care of your incision. Make sure you: ? Wash your  hands with soap and water before and after you change your bandage (dressing). If soap and water are not available, use hand sanitizer. ? Change your dressing as told by your health care provider. ? Leave stitches (sutures), skin glue, or adhesive strips in place. These skin closures may need to stay in place for 2 weeks or longer. If adhesive strip edges start to loosen and curl up, you may trim the loose edges. Do not remove adhesive strips completely unless your health care provider tells you to do that.  Check your incision area every day for signs of infection. Check for: ? More redness, swelling, or pain. ? Fluid or blood. ? Warmth. ? Pus or a bad smell.  Keep your dressing clean and dry.  If you were sent home with a surgical drain in place, follow instructions from your health care provider about emptying it. Bathing  Do not take baths, swim, or use a hot tub until your health care provider approves.  Ask your health care provider if you may take showers. You may only be allowed to take sponge baths. Activity  Rest as told by your health care provider.  Avoid sitting for a long time without moving. Get up to take short walks every 1-2 hours. This is important to improve blood flow and breathing. Ask for help if you feel weak or unsteady.  Return to your normal activities as told by your health care provider. Ask your health care provider what activities are safe for you.  Be careful to avoid any activities that  could cause an injury to your arm on the side of your surgery.  Do not lift anything that is heavier than 10 lb (4.5 kg), or the limit that you are told, until your health care provider says that it is safe. Avoid lifting with the arm that is on the side of your surgery.  Do not carry heavy objects on your shoulder on the side of your surgery.  Do exercises to keep your shoulder and arm from getting stiff and swollen. Talk with your health care provider about which  exercises are safe for you. General instructions  Wear a supportive bra as told by your health care provider.  Raise (elevate) your arm above the level of your heart while you are sitting or lying down.  Do not wear tight jewelry on your arm, wrist, or fingers on the side of your surgery.  Keep all follow-up visits as told by your health care provider. This is important. ? You may need to be screened for extra fluid around the lymph nodes and swelling in the breast and arm (lymphedema). Follow instructions from your health care provider about how often you should be checked.  If you had any lymph nodes removed during your procedure, be sure to tell all of your health care providers. This is important information to share before you are involved in certain procedures, such as having blood tests or having your blood pressure taken. Contact a health care provider if:  You develop a rash.  You have a fever.  Your pain medicine is not working.  You have swelling, weakness, or numbness in your arm that does not improve after a few weeks.  You have new swelling in your breast.  You have any of these signs of infection: ? More redness, swelling, or pain in your incision area. ? Fluid or blood coming from your incision. ? Warmth coming from the incision area. ? Pus or a bad smell coming from your incision. Get help right away if you have:  Very bad pain in your breast or arm.  Swelling in your legs or arms.  Redness, warmth, or pain in your leg or arm.  Chest pain.  Difficulty breathing. Summary  After the procedure, it is common to have breast tenderness, swelling in your breast, and stiffness in your arm and shoulder.  Follow instructions from your health care provider about how to take care of your incision.  Do not lift anything that is heavier than 10 lb (4.5 kg), or the limit that you are told, until your health care provider says that it is safe. Avoid lifting with the  arm that is on the side of your surgery.  If you had any lymph nodes removed during your procedure, be sure to tell all of your health care providers. This is important information to share before you are involved in certain procedures, such as having blood tests or having your blood pressure taken. This information is not intended to replace advice given to you by your health care provider. Make sure you discuss any questions you have with your health care provider. Document Revised: 01/31/2019 Document Reviewed: 01/31/2019 Elsevier Patient Education  [place].

## 2020-06-22 NOTE — Progress Notes (Signed)
06/22/2020  HPI: Jasmine Little is a 64 y.o. female s/p right breast RF localized lumpectomy on 10/28.  She presents today for follow up.  I had discussed with her earlier this week her pathology results which showed sclerosing intraductal papilloma, negative for atypia or malignancy.  She reports she's doing well.  Denies any pain or issues with the incision.  She's wondering how much longer she needs to her a tighter sports bra and if she can apply any lotion over the incision.  Vital signs: BP (!) 181/61   Pulse 70   Temp 98.5 F (36.9 C) (Oral)   Ht 5' 1.5" (1.562 m)   Wt 230 lb (104.3 kg)   SpO2 97%   BMI 42.75 kg/m    Physical Exam: Constitutional: No acute distress Breast:  Right breast s/p upper inner quadrant lumpectomy with periareolar incision.  Incision is healing well, without any evidence of infection or breakdown.  Still some dermabond in place.    Assessment/Plan: This is a 64 y.o. female s/p right breast lumpectomy.  --Discussed with the patient again the pathology results. --She's healing well.  Recommended that she can start applying moisturizing lotion or vitamin E based lotions once the dermabond is fully peeled off. --May return to wearing normal bras --Follow up in a year with bilateral mammogram.   Melvyn Neth, MD [facility]

## 2020-07-27 ENCOUNTER — Ambulatory Visit (INDEPENDENT_AMBULATORY_CARE_PROVIDER_SITE_OTHER): Payer: 59 | Admitting: Surgery

## 2020-07-27 ENCOUNTER — Encounter: Payer: Self-pay | Admitting: Surgery

## 2020-07-27 ENCOUNTER — Other Ambulatory Visit: Payer: Self-pay

## 2020-07-27 VITALS — BP 148/78 | HR 57 | Temp 99.1°F | Ht 61.5 in | Wt 232.6 lb

## 2020-07-27 DIAGNOSIS — D241 Benign neoplasm of right breast: Secondary | ICD-10-CM

## 2020-07-27 DIAGNOSIS — Z09 Encounter for follow-up examination after completed treatment for conditions other than malignant neoplasm: Secondary | ICD-10-CM

## 2020-07-27 NOTE — Progress Notes (Signed)
07/27/2020  HPI: Jasmine Little is a 64 y.o. female s/p right breast lumpectomy on 10/28 for intraductal papilloma.  She presents today for follow up. She called the office because on her self breast exam, she palpated an area in the scar tissue that she feels was not there on her previous exam last month.  Denies any worsening pain, skin changes, or nipple drainage.  Vital signs: BP (!) 148/78   Pulse (!) 57   Temp 99.1 F (37.3 C) (Oral)   Ht 5' 1.5" (1.562 m)   Wt 232 lb 9.6 oz (105.5 kg)   SpO2 98%   BMI 43.24 kg/m    Physical Exam: Constitutional:  No acute distress Breast:  Right breast s/p right lumpectomy with periareolar incision well healed.  In the middle of the incision, there is an area about 1.5 cm that is firmer to palpation, just deep to the skin incision, which feels like scar tissue itself.  It is not deep, and it move with the skin during palpation.  Assessment/Plan: This is a 64 y.o. female s/p right lumpectomy for intraductal papilloma.  --Discussed with the patient that the area of concern feels like scar tissue to me.  It is just deep to the skin level, and does not appear to extend deep into the breast tissue.  There is no evidence of skin changes or nipple changes otherwise.   --Recommended to observe this area for the time being.  If she notices any further changes on her self exams, she should contact us for another follow up and imaging.  Otherwise, she's should follow up around August/September 2022 after her yearly mammogram   Melvyn Neth, [place]

## 2020-07-27 NOTE — Patient Instructions (Addendum)
The area you are feeling seems to be scar tissue and will not get bigger. Call us if this changes.   Follow-up with our office as needed.  Please call and ask to speak with a nurse if you develop questions or concerns.    Breast Self-Awareness Breast self-awareness is knowing how your breasts look and feel. Doing breast self-awareness is important. It allows you to catch a breast problem early while it is still small and can be treated. All women should do breast self-awareness, including women who have had breast implants. Tell your doctor if you notice a change in your breasts. What you need:  A mirror.  A well-lit room. How to do a breast self-exam A breast self-exam is one way to learn what is normal for your breasts and to check for changes. To do a breast self-exam: Look for changes  1. Take off all the clothes above your waist. 2. Stand in front of a mirror in a room with good lighting. 3. Put your hands on your hips. 4. Push your hands down. 5. Look at your breasts and nipples in the mirror to see if one breast or nipple looks different from the other. Check to see if: ? The shape of one breast is different. ? The size of one breast is different. ? There are wrinkles, dips, and bumps in one breast and not the other. 6. Look at each breast for changes in the skin, such as: ? Redness. ? Scaly areas. 7. Look for changes in your nipples, such as: ? Liquid around the nipples. ? Bleeding. ? Dimpling. ? Redness. ? A change in where the nipples are. Feel for changes  1. Lie on your back on the floor. 2. Feel each breast. To do this, follow these steps: ? Pick a breast to feel. ? Put the arm closest to that breast above your head. ? Use your other arm to feel the nipple area of your breast. Feel the area with the pads of your three middle fingers by making small circles with your fingers. For the first circle, press lightly. For the second circle, press harder. For the third  circle, press even harder. ? Keep making circles with your fingers at the different pressures as you move down your breast. Stop when you feel your ribs. ? Move your fingers a little toward the center of your body. ? Start making circles with your fingers again, this time going up until you reach your collarbone. ? Keep making up-and-down circles until you reach your armpit. Remember to keep using the three pressures. ? Feel the other breast in the same way. 3. Sit or stand in the tub or shower. 4. With soapy water on your skin, feel each breast the same way you did in step 2 when you were lying on the floor. Write down what you find Writing down what you find can help you remember what to tell your doctor. Write down:  What is normal for each breast.  Any changes you find in each breast, including: ? The kind of changes you find. ? Whether you have pain. ? Size and location of any lumps.  When you last had your menstrual period. General tips  Check your breasts every month.  If you are breastfeeding, the best time to check your breasts is after you feed your baby or after you use a breast pump.  If you get menstrual periods, the best time to check your breasts is 5-7 days  after your menstrual period is over.  With time, you will become comfortable with the self-exam, and you will begin to know if there are changes in your breasts. Contact a doctor if you:  See a change in the shape or size of your breasts or nipples.  See a change in the skin of your breast or nipples, such as red or scaly skin.  Have fluid coming from your nipples that is not normal.  Find a lump or thick area that was not there before.  Have pain in your breasts.  Have any concerns about your breast health. Summary  Breast self-awareness includes looking for changes in your breasts, as well as feeling for changes within your breasts.  Breast self-awareness should be done in front of a mirror in a  well-lit room.  You should check your breasts every month. If you get menstrual periods, the best time to check your breasts is 5-7 days after your menstrual period is over.  Let your doctor know of any changes you see in your breasts, including changes in size, changes on the skin, pain or tenderness, or fluid from your nipples that is not normal. This information is not intended to replace advice given to you by your health care provider. Make sure you discuss any questions you have with your health care provider. Document Revised: 03/16/2018 Document Reviewed: 03/16/2018 Elsevier Patient Education  [place].

## 2021-03-20 ENCOUNTER — Other Ambulatory Visit: Payer: Self-pay | Admitting: Family Medicine

## 2021-03-20 DIAGNOSIS — Z1231 Encounter for screening mammogram for malignant neoplasm of breast: Secondary | ICD-10-CM

## 2021-04-11 ENCOUNTER — Ambulatory Visit
Admission: RE | Admit: 2021-04-11 | Discharge: 2021-04-11 | Disposition: A | Discharge status: Home / Self Care | Payer: 59 | Source: Ambulatory Visit | Attending: Family Medicine | Admitting: Family Medicine

## 2021-04-11 ENCOUNTER — Other Ambulatory Visit: Payer: Self-pay

## 2021-04-11 DIAGNOSIS — Z1231 Encounter for screening mammogram for malignant neoplasm of breast: Secondary | ICD-10-CM | POA: Diagnosis not present

## 2021-04-19 ENCOUNTER — Ambulatory Visit: Payer: Self-pay | Admitting: Surgery

## 2021-04-22 ENCOUNTER — Other Ambulatory Visit: Payer: Self-pay

## 2021-04-22 ENCOUNTER — Encounter: Payer: Self-pay | Admitting: Surgery

## 2021-04-22 ENCOUNTER — Ambulatory Visit (INDEPENDENT_AMBULATORY_CARE_PROVIDER_SITE_OTHER): Payer: 59 | Admitting: Surgery

## 2021-04-22 VITALS — BP 144/69 | HR 63 | Temp 98.2°F | Ht 61.5 in | Wt 237.0 lb

## 2021-04-22 DIAGNOSIS — D241 Benign neoplasm of right breast: Secondary | ICD-10-CM | POA: Diagnosis not present

## 2021-04-22 NOTE — Patient Instructions (Signed)
If you have any concerns or questions, please feel free to call our office. Follow up as needed.   Breast Self-Awareness Breast self-awareness is knowing how your breasts look and feel. Doing breast self-awareness is important. It allows you to catch a breast problem early while it is still small and can be treated. All women should do breast self-awareness, including women who have had breast implants. Tell your doctor if you notice a change in your breasts. What you need: A mirror. A well-lit room. How to do a breast self-exam A breast self-exam is one way to learn what is normal for your breasts and to check for changes. To do a breast self-exam: Look for changes  Take off all the clothes above your waist. Stand in front of a mirror in a room with good lighting. Put your hands on your hips. Push your hands down. Look at your breasts and nipples in the mirror to see if one breast or nipple looks different from the other. Check to see if: The shape of one breast is different. The size of one breast is different. There are wrinkles, dips, and bumps in one breast and not the other. Look at each breast for changes in the skin, such as: Redness. Scaly areas. Look for changes in your nipples, such as: Liquid around the nipples. Bleeding. Dimpling. Redness. A change in where the nipples are. Feel for changes  Lie on your back on the floor. Feel each breast. To do this, follow these steps: Pick a breast to feel. Put the arm closest to that breast above your head. Use your other arm to feel the nipple area of your breast. Feel the area with the pads of your three middle fingers by making small circles with your fingers. For the first circle, press lightly. For the second circle, press harder. For the third circle, press even harder. Keep making circles with your fingers at the different pressures as you move down your breast. Stop when you feel your ribs. Move your fingers a little  toward the center of your body. Start making circles with your fingers again, this time going up until you reach your collarbone. Keep making up-and-down circles until you reach your armpit. Remember to keep using the three pressures. Feel the other breast in the same way. Sit or stand in the tub or shower. With soapy water on your skin, feel each breast the same way you did in step 2 when you were lying on the floor. Write down what you find Writing down what you find can help you remember what to tell your doctor. Write down: What is normal for each breast. Any changes you find in each breast, including: The kind of changes you find. Whether you have pain. Size and location of any lumps. When you last had your menstrual period. General tips Check your breasts every month. If you are breastfeeding, the best time to check your breasts is after you feed your baby or after you use a breast pump. If you get menstrual periods, the best time to check your breasts is 5-7 days after your menstrual period is over. With time, you will become comfortable with the self-exam, and you will begin to know if there are changes in your breasts. Contact a doctor if you: See a change in the shape or size of your breasts or nipples. See a change in the skin of your breast or nipples, such as red or scaly skin. Have fluid coming from  your nipples that is not normal. Find a lump or thick area that was not there before. Have pain in your breasts. Have any concerns about your breast health. Summary Breast self-awareness includes looking for changes in your breasts, as well as feeling for changes within your breasts. Breast self-awareness should be done in front of a mirror in a well-lit room. You should check your breasts every month. If you get menstrual periods, the best time to check your breasts is 5-7 days after your menstrual period is over. Let your doctor know of any changes you see in your breasts,  including changes in size, changes on the skin, pain or tenderness, or fluid from your nipples that is not normal. This information is not intended to replace advice given to you by your health care provider. Make sure you discuss any questions you have with your health care provider. Document Revised: 03/16/2018 Document Reviewed: 03/16/2018 Elsevier Patient Education  [place].

## 2021-04-22 NOTE — Progress Notes (Signed)
04/22/2021  History of Present Illness: Jasmine Little is a 65 y.o. female s/p right breast lumpectomy for intraductal papilloma on 06/07/20.  She presents for follow up.  She had a mammogram on 04/11/21 which did not show any suspicious findings.  I have personally viewed the images.  She denies any new issues, particularly no breast pain, masses, skin changes, or drainage from nipple.  Past Medical History: Past Medical History:  Diagnosis Date   Arthritis    Dermatitis    GERD (gastroesophageal reflux disease)    Hyperlipidemia    Hypertension    Pre-diabetes      Past Surgical History: Past Surgical History:  Procedure Laterality Date   APPENDECTOMY  age 18   BREAST BIOPSY Right 05/08/2020   BENIGN INTRADUCTAL PAPILLOMA    BREAST LUMPECTOMY WITH RADIOFREQUENCY TAG IDENTIFICATION Right 06/07/2020   Procedure: BREAST LUMPECTOMY WITH RADIOFREQUENCY TAG IDENTIFICATION;  Surgeon: Olean Ree, MD;  Location: [facility] ORS;  Service: General;  Laterality: Right;   DILATION AND CURETTAGE OF UTERUS  age 87   MYOMECTOMY  2001    Home Medications: Prior to Admission medications   Medication Sig Start Date End Date Taking? Authorizing Provider  diphenhydrAMINE (BENADRYL) 25 MG tablet Take 25 mg by mouth daily as needed for allergies.   Yes [provider]  diphenhydrAMINE HCl, Sleep, (ZZZQUIL) 25 MG CAPS Take 50 mg by mouth at bedtime as needed (sleep).   Yes [provider]  hydrochlorothiazide (HYDRODIURIL) 12.5 MG tablet Take 12.5 mg by mouth daily. 03/19/20  Yes [provider]  ketoconazole (NIZORAL) 2 % cream Apply 1 application topically daily as needed (rash).  04/17/20  Yes [provider]  lisinopril (ZESTRIL) 40 MG tablet Take 40 mg by mouth daily.  05/23/20  Yes [provider]  lovastatin (MEVACOR) 40 MG tablet Take 40 mg by mouth at bedtime.    Yes [provider]  triamcinolone cream (KENALOG) 0.5 % Apply 1 application  topically 2 (two) times daily as needed (rash).  05/02/20  Yes [provider]  vitamin E 180 MG (400 UNITS) capsule Take 400 Units by mouth daily.   Yes [provider]  omeprazole (PRILOSEC) 20 MG capsule Take 20 mg by mouth daily.  03/19/20 03/19/21  [provider]    Allergies: No Known Allergies  Review of Systems: Review of Systems  Constitutional:  Negative for chills and fever.  Respiratory:  Negative for shortness of breath.   Cardiovascular:  Negative for chest pain.  Gastrointestinal:  Negative for nausea and vomiting.  Skin:  Negative for rash.   Physical Exam BP (!) 144/69   Pulse 63   Temp 98.2 F (36.8 C)   Ht 5' 1.5" (1.562 m)   Wt 237 lb (107.5 kg)   SpO2 95%   BMI 44.06 kg/m  CONSTITUTIONAL: No acute distress HEENT:  Normocephalic, atraumatic, extraocular motion intact. RESPIRATORY:  Normal respiratory effort without pathologic use of accessory muscles. CARDIOVASCULAR: Regular rhythm and rate. BREAST:  Right breast s/p lumpectomy in retroareolar region of upper inner quadrant.  Periareolar scar is well healed and very faint.  No palpable masses, skin changes, or nipple changes.  No right axillary lymphadenopathy.  Left breast without any palpable masses, skin changes, or nipple changes.  No left axillary lymphadenopathy. NEUROLOGIC:  Motor and sensation is grossly normal.  Cranial nerves are grossly intact. PSYCH:  Alert and oriented to person, place and time. Affect is normal.  Labs/Imaging: Mammogram 04/11/21: FINDINGS: There are  no findings suspicious for malignancy.  IMPRESSION: No mammographic evidence of malignancy. A result letter of this screening mammogram will be mailed directly to the patient.   RECOMMENDATION: Screening mammogram in one year. (Code:SM-B-01Y)   BI-RADS CATEGORY  1: Negative.  Assessment and Plan: This is a 65 y.o. female s/p right breast lumpectomy  --Patient is doing very well without any complaints  after her surgery.  Her mammogram did not show any suspicious findings and her exam today is reassuring. --Discussed with her that she should continue her yearly screening mammogram which can be scheduled with her PCP.  Otherwise from our standpoint, may follow up with [place] as needed.  Face-to-face time spent with the patient and care providers was 15 minutes, with more than 50% of the time spent counseling, educating, and coordinating care of the patient.     Melvyn Neth, [facility]

## 2021-10-17 DIAGNOSIS — N289 Disorder of kidney and ureter, unspecified: Secondary | ICD-10-CM | POA: Diagnosis not present

## 2021-10-17 DIAGNOSIS — E7800 Pure hypercholesterolemia, unspecified: Secondary | ICD-10-CM | POA: Diagnosis not present

## 2021-10-17 DIAGNOSIS — I10 Essential (primary) hypertension: Secondary | ICD-10-CM | POA: Diagnosis not present

## 2021-10-17 DIAGNOSIS — D508 Other iron deficiency anemias: Secondary | ICD-10-CM | POA: Diagnosis present

## 2021-10-17 DIAGNOSIS — Z862 Personal history of diseases of the blood and blood-forming organs and certain disorders involving the immune mechanism: Secondary | ICD-10-CM | POA: Diagnosis not present

## 2021-10-17 DIAGNOSIS — Z6841 Body Mass Index (BMI) 40.0 and over, adult: Secondary | ICD-10-CM | POA: Diagnosis not present

## 2021-10-17 DIAGNOSIS — E1169 Type 2 diabetes mellitus with other specified complication: Secondary | ICD-10-CM | POA: Diagnosis not present

## 2021-12-04 ENCOUNTER — Other Ambulatory Visit: Payer: Self-pay

## 2021-12-04 ENCOUNTER — Inpatient Hospital Stay
Admission: EM | Admit: 2021-12-04 | Discharge: 2021-12-06 | DRG: 378 | Disposition: A | Discharge status: Home / Self Care | Payer: Medicare HMO | Attending: Osteopathic Medicine | Admitting: Osteopathic Medicine

## 2021-12-04 DIAGNOSIS — Z79899 Other long term (current) drug therapy: Secondary | ICD-10-CM

## 2021-12-04 DIAGNOSIS — D649 Anemia, unspecified: Secondary | ICD-10-CM | POA: Diagnosis not present

## 2021-12-04 DIAGNOSIS — Z6841 Body Mass Index (BMI) 40.0 and over, adult: Secondary | ICD-10-CM

## 2021-12-04 DIAGNOSIS — Z7984 Long term (current) use of oral hypoglycemic drugs: Secondary | ICD-10-CM | POA: Diagnosis not present

## 2021-12-04 DIAGNOSIS — Z8619 Personal history of other infectious and parasitic diseases: Secondary | ICD-10-CM | POA: Diagnosis present

## 2021-12-04 DIAGNOSIS — D508 Other iron deficiency anemias: Secondary | ICD-10-CM | POA: Diagnosis present

## 2021-12-04 DIAGNOSIS — R195 Other fecal abnormalities: Secondary | ICD-10-CM | POA: Diagnosis not present

## 2021-12-04 DIAGNOSIS — E785 Hyperlipidemia, unspecified: Secondary | ICD-10-CM | POA: Diagnosis not present

## 2021-12-04 DIAGNOSIS — E7800 Pure hypercholesterolemia, unspecified: Secondary | ICD-10-CM | POA: Diagnosis present

## 2021-12-04 DIAGNOSIS — K922 Gastrointestinal hemorrhage, unspecified: Secondary | ICD-10-CM

## 2021-12-04 DIAGNOSIS — Z9889 Other specified postprocedural states: Secondary | ICD-10-CM | POA: Diagnosis not present

## 2021-12-04 DIAGNOSIS — Z87891 Personal history of nicotine dependence: Secondary | ICD-10-CM | POA: Diagnosis not present

## 2021-12-04 DIAGNOSIS — K921 Melena: Secondary | ICD-10-CM | POA: Diagnosis not present

## 2021-12-04 DIAGNOSIS — E1169 Type 2 diabetes mellitus with other specified complication: Secondary | ICD-10-CM | POA: Diagnosis not present

## 2021-12-04 DIAGNOSIS — K219 Gastro-esophageal reflux disease without esophagitis: Secondary | ICD-10-CM | POA: Diagnosis present

## 2021-12-04 DIAGNOSIS — R0602 Shortness of breath: Secondary | ICD-10-CM | POA: Diagnosis not present

## 2021-12-04 DIAGNOSIS — N289 Disorder of kidney and ureter, unspecified: Secondary | ICD-10-CM

## 2021-12-04 DIAGNOSIS — I10 Essential (primary) hypertension: Secondary | ICD-10-CM | POA: Diagnosis not present

## 2021-12-04 DIAGNOSIS — D500 Iron deficiency anemia secondary to blood loss (chronic): Secondary | ICD-10-CM | POA: Diagnosis present

## 2021-12-04 DIAGNOSIS — K317 Polyp of stomach and duodenum: Secondary | ICD-10-CM | POA: Diagnosis not present

## 2021-12-04 DIAGNOSIS — D131 Benign neoplasm of stomach: Secondary | ICD-10-CM | POA: Diagnosis not present

## 2021-12-04 DIAGNOSIS — D509 Iron deficiency anemia, unspecified: Secondary | ICD-10-CM | POA: Diagnosis not present

## 2021-12-04 DIAGNOSIS — R1013 Epigastric pain: Secondary | ICD-10-CM | POA: Diagnosis not present

## 2021-12-04 LAB — PROTIME-INR
INR: 1 (ref 0.8–1.2)
Prothrombin Time: 13.4 seconds (ref 11.4–15.2)

## 2021-12-04 LAB — COMPREHENSIVE METABOLIC PANEL
ALT: 16 U/L (ref 0–44)
AST: 16 U/L (ref 15–41)
Albumin: 3.9 g/dL (ref 3.5–5.0)
Alkaline Phosphatase: 68 U/L (ref 38–126)
Anion gap: 9 (ref 5–15)
BUN: 21 mg/dL (ref 8–23)
CO2: 25 mmol/L (ref 22–32)
Calcium: 8.8 mg/dL — ABNORMAL LOW (ref 8.9–10.3)
Chloride: 100 mmol/L (ref 98–111)
Creatinine, Ser: 1.3 mg/dL — ABNORMAL HIGH (ref 0.44–1.00)
GFR, Estimated: 46 mL/min — ABNORMAL LOW (ref >60)
Glucose, Bld: 163 mg/dL — ABNORMAL HIGH (ref 70–99)
Potassium: 3.8 mmol/L (ref 3.5–5.1)
Sodium: 134 mmol/L — ABNORMAL LOW (ref 135–145)
Total Bilirubin: 0.3 mg/dL (ref 0.3–1.2)
Total Protein: 7.1 g/dL (ref 6.5–8.1)

## 2021-12-04 LAB — CBC
HCT: 21.2 % — ABNORMAL LOW (ref 36.0–46.0)
Hemoglobin: 6.3 g/dL — ABNORMAL LOW (ref 12.0–15.0)
MCH: 23.9 pg — ABNORMAL LOW (ref 26.0–34.0)
MCHC: 29.7 g/dL — ABNORMAL LOW (ref 30.0–36.0)
MCV: 80.3 fL (ref 80.0–100.0)
Platelets: 429 10*3/uL — ABNORMAL HIGH (ref 150–400)
RBC: 2.64 MIL/uL — ABNORMAL LOW (ref 3.87–5.11)
RDW: 22.5 % — ABNORMAL HIGH (ref 11.5–15.5)
WBC: 14.6 10*3/uL — ABNORMAL HIGH (ref 4.0–10.5)
nRBC: 0.1 % (ref 0.0–0.2)

## 2021-12-04 LAB — TROPONIN I (HIGH SENSITIVITY)
Troponin I (High Sensitivity): 5 ng/L (ref <18)
Troponin I (High Sensitivity): 5 ng/L (ref <18)

## 2021-12-04 LAB — ABO/RH: ABO/RH(D): A POS

## 2021-12-04 LAB — PREPARE RBC (CROSSMATCH)

## 2021-12-04 MED ORDER — PANTOPRAZOLE 80MG IVPB - SIMPLE MED
80.0000 mg | Freq: Once | INTRAVENOUS | Status: AC
  Administered 2021-12-04: 80 mg via INTRAVENOUS
  Filled 2021-12-04: qty 100

## 2021-12-04 MED ORDER — SODIUM CHLORIDE 0.9 % IV SOLN: 10.0000 mL/h | Freq: Once | INTRAVENOUS | Status: DC

## 2021-12-04 MED ORDER — PANTOPRAZOLE SODIUM 40 MG IV SOLR: 40.0000 mg | Freq: Two times a day (BID) | INTRAVENOUS | Status: DC

## 2021-12-04 MED ORDER — PANTOPRAZOLE INFUSION (NEW) - SIMPLE MED
8.0000 mg/h | INTRAVENOUS | Status: DC
  Administered 2021-12-04 – 2021-12-05 (×3): 8 mg/h via INTRAVENOUS
  Filled 2021-12-04 (×5): qty 100

## 2021-12-04 NOTE — Hospital Course (Addendum)
Presents to ED 12/04/21 w/ abnormal labs, abd discomfort. Pt has been off PPI at home, reports mild abdominal discomfort, had episodes of non bloody emesis last week, followed by dark/black stool. SOB/weakness, sought care outpatient and Hgb 6.2 so came to ED. ED started 1 unit PRBC, IV protonix. Consulted GI - Dr Alice Reichert will see pt, plan for EGD 12/05/21. Getting 1 unit PRBC ordered by ED. ED also started IV Protonix. NPO after midnight. AM CBC only minimal improvement and pt needed another unit PRBC since Hgb <7 precludes non-emergent anesthesia.  ?

## 2021-12-04 NOTE — Assessment & Plan Note (Signed)
?   PPI IV for now ?? Resume PO PPI on discharge ?? GI following  ?

## 2021-12-04 NOTE — ED Triage Notes (Signed)
Pt comes with c/o abnormal lab of Hgb of 6.2. pt states dark tarry stools for weeks. Pt state some belly pain. ?

## 2021-12-04 NOTE — Assessment & Plan Note (Addendum)
Pt on iron but dark stool is a new issue ?? Monitor CBC, see above for GI bleed  ?

## 2021-12-04 NOTE — Assessment & Plan Note (Addendum)
08/2019 (+)gastric lipoma ?? EGD 12/05/21  ?

## 2021-12-04 NOTE — ED Notes (Signed)
Pt to ED for abnormal labs, pt lab drawn resulting in Hgb 6.3. Pt states she had dark stool recently, pt does take iron however states that she started having dark stool before taking iron supplement. Pt c/o generalized abdominal pain, and weakness. Pt states she has been having SOB with exertion recently. Denies CP.  ? ?Pt is A&ox4.  ?

## 2021-12-04 NOTE — Assessment & Plan Note (Addendum)
Creatinine at baseline ?? Continue to monitor  ?

## 2021-12-04 NOTE — Assessment & Plan Note (Addendum)
Pt reports controlled on Metformin ?A1C was 6.8 on 10/04/21 ?? Hold meformin for now ?? Monitor Glc w/ AM labs  ?? Consider SSI if hyperglycemic but given CLD/NPO will monitor for now  ?

## 2021-12-04 NOTE — ED Provider Notes (Signed)
? ?[facility] ?Provider Note ? ? ? Event Date/Time  ? First MD Initiated Contact with Patient 12/04/21 1507   ?  (approximate) ? ? ?History  ? ?Abnormal Lab ? ? ?HPI ? ?Jasmine Little is a 66 y.o. female who presents to the ED for evaluation of Abnormal Lab ?  ?Reviewed walk-in clinic visit from earlier today where patient was seen for dyspnea, fatigue and melena.  Blood work was drawn that demonstrated anemia and she was urged to come to the ED.  Hemoglobin of 6.2.  Normocytic. ?No endoscopies on file ?Hx HTN, GERD, HLD, DM, obesity.  ? ?Patient presents to the ED for evaluation of subacutely worsening fatigue, dyspnea on exertion and presyncope.  She reports feeling weak and tired and worsening generalized symptoms.  Denies any fevers, syncopal episodes, falls or trauma.  Reports feeling okay while seated and supine, but very dizzy when she gets up.  Difficulty in a hot shower with presyncope. ? ?She reports melena for a few weeks that she attributed to her iron supplementation.  She has been inconsistently taking her PPI as she thought she did not need to take it every day.  Denies any other bleeding diatheses beyond melena. ? ? ?Physical Exam  ? ?Triage Vital Signs: ?ED Triage Vitals  ?Enc Vitals Group  ?   BP 12/04/21 1405 138/62  ?   Pulse Rate 12/04/21 1405 99  ?   Resp 12/04/21 1405 19  ?   Temp 12/04/21 1405 98.8 ?F (37.1 ?C)  ?   Temp src --   ?   SpO2 12/04/21 1405 100 %  ?   Weight --   ?   Height --   ?   Head Circumference --   ?   Peak Flow --   ?   Pain Score 12/04/21 1404 4  ?   Pain Loc --   ?   Pain Edu? --   ?   Excl. in [place]? --   ? ? ?Most recent vital signs: ?Vitals:  ? 12/04/21 1405 12/04/21 1708  ?BP: 138/62 (!) 107/93  ?Pulse: 99 91  ?Resp: 19 (!) 22  ?Temp: 98.8 ?F (37.1 ?C)   ?SpO2: 100% 100%  ? ? ?General: Awake, no distress.  Obese.  Pleasant and conversational. ?CV:  Good peripheral perfusion.  ?Resp:  Normal effort.  ?Abd:  No distention.  Soft and  benign ?MSK:  No deformity noted.  ?Neuro:  No focal deficits appreciated. Cranial nerves II through XII intact ?5/5 strength and sensation in all 4 extremities ?Other:   ? ? ?ED Results / Procedures / Treatments  ? ?Labs ?(all labs ordered are listed, but only abnormal results are displayed) ?Labs Reviewed  ?COMPREHENSIVE METABOLIC PANEL - Abnormal; Notable for the following components:  ?    Result Value  ? Sodium 134 (*)   ? Glucose, Bld 163 (*)   ? Creatinine, Ser 1.30 (*)   ? Calcium 8.8 (*)   ? GFR, Estimated 46 (*)   ? All other components within normal limits  ?CBC - Abnormal; Notable for the following components:  ? WBC 14.6 (*)   ? RBC 2.64 (*)   ? Hemoglobin 6.3 (*)   ? HCT 21.2 (*)   ? MCH 23.9 (*)   ? MCHC 29.7 (*)   ? RDW 22.5 (*)   ? Platelets 429 (*)   ? All other components within normal limits  ?PROTIME-INR  ?POC OCCULT BLOOD, ED  ?  TYPE AND SCREEN  ?PREPARE RBC (CROSSMATCH)  ?ABO/RH  ?TROPONIN I (HIGH SENSITIVITY)  ? ? ?EKG ? ? ?RADIOLOGY ? ? ?Official radiology report(s): ?No results found. ? ?PROCEDURES and INTERVENTIONS: ? ?.1-3 Lead EKG Interpretation ?Performed by: Vladimir Crofts, MD ?Authorized by: Vladimir Crofts, MD  ? ?  Interpretation: normal   ?  ECG rate:  91 ?  ECG rate assessment: normal   ?  Rhythm: sinus rhythm   ?  Ectopy: none   ?  Conduction: normal   ?.Critical Care ?Performed by: Vladimir Crofts, MD ?Authorized by: Vladimir Crofts, MD  ? ?Critical care provider statement:  ?  Critical care time (minutes):  30 ?  Critical care time was exclusive of:  Separately billable procedures and treating other patients ?  Critical care was necessary to treat or prevent imminent or life-threatening deterioration of the following conditions:  Circulatory failure ?  Critical care was time spent personally by me on the following activities:  Development of treatment plan with patient or surrogate, discussions with consultants, evaluation of patient's response to treatment, examination of patient,  ordering and review of laboratory studies, ordering and review of radiographic studies, ordering and performing treatments and interventions, pulse oximetry, re-evaluation of patient's condition and review of old charts ? ?Medications  ?pantoprozole (PROTONIX) 80 mg /NS 100 mL infusion (has no administration in time range)  ?pantoprazole (PROTONIX) injection 40 mg (has no administration in time range)  ?0.9 %  sodium chloride infusion (has no administration in time range)  ?pantoprazole (PROTONIX) 80 mg /NS 100 mL IVPB (80 mg Intravenous New Bag/Given 12/04/21 1708)  ? ? ? ?IMPRESSION / MDM / ASSESSMENT AND PLAN / ED COURSE  ?I reviewed the triage vital signs and the nursing notes. ? ?66 year old woman with history of GERD presents to the ED with evidence of symptomatic anemia from an upper GI bleed requiring blood transfusions and medical admission on a PPI drip.  She is hemodynamically stable and looks systemically well.  Benign abdominal examination.  No signs of trauma or neurologic deficits.  Blood work with normocytic anemia.  Leukocytosis is noted.  Normal electrolytes and INR.  We will start her on a PPI bolus and drip, initiate transfusion of 1 unit of PRBCs and consult medicine for admission. ? ?Clinical Course as of 12/04/21 1733  ?Wed Dec 04, 2021  ?1636 EGD in 08/2019. Benign stomach lipoma [DS]  ?1719 I consult with medicine, who agrees to admit [DS]  ?  ?Clinical Course User Index ?[DS] Vladimir Crofts, MD  ? ? ? ?FINAL CLINICAL IMPRESSION(S) / ED DIAGNOSES  ? ?Final diagnoses:  ?Symptomatic anemia  ?Upper GI bleed  ? ? ? ?Rx / DC Orders  ? ?ED Discharge Orders   ? ? None  ? ?  ? ? ? ?Note:  This document was prepared using Dragon voice recognition software and may include unintentional dictation errors. ?  ?Vladimir Crofts, MD ?12/04/21 1734 ? ?

## 2021-12-04 NOTE — Assessment & Plan Note (Addendum)
Pt has been off PPI at home, reports mild abdominal discomfort, had episodes of non bloody emesis last week, followed by dark/black stool. SOB/weakness, sought care outpatient and Hgb 6.2 so came to ED. ED started 1 unit PRBC, IV protonix.  ?? S/p 2 units PRBC in hospital  ?? IV Protonix ?? GI following, EGD 12/05/21 demonstrated normal esophagus, medium polypoid noncircumferential mass with oozing bleeding and stigmata of recent bleeding found in gastric body, biopsy taken, hemostatic clip successfully placed, no bleeding at end of procedure. ?

## 2021-12-04 NOTE — Assessment & Plan Note (Signed)
?   Continue home lovastatin ?

## 2021-12-04 NOTE — Assessment & Plan Note (Signed)
?   Continue home meds w/ lisinopril and HCT ?

## 2021-12-04 NOTE — H&P (Signed)
?History and Physical  ? ? ?PatientPayge Little KNL:[id number] DOB: 21-Oct-1955 ?DOA: 12/04/2021 ?DOS: the patient was seen and examined on 12/04/2021 ?PCP: Jasmine Body, MD  ?Patient coming from: Home ? ?Chief Complaint:  ?Chief Complaint  ?Patient presents with  ? Abnormal Lab  ? ?HPI: Jasmine Little is a 66 y.o. female who  has a past medical history of Arthritis, Dermatitis, GERD (gastroesophageal reflux disease), Hyperlipidemia, Hypertension, and Pre-diabetes.  ? ?Subjective: pt repports abnormal labs, abd discomfort. Pt has been off PPI at home, reports mild abdominal discomfort, had episodes of non bloody emesis last week, followed by dark/black stool. SOB/weakness, sought care outpatient and Hgb 6.2 so came to ED. Biggest complaint now is wanting dinner, she denies lightheadedness, severe pain, active bloody stool, bloody or coffee grounds emesis.  ? ?ED course: ED started 1 unit PRBC, IV protonix.  ? ?Consulted GI - Dr Jasmine Little will see pt, plan for likely EGD tomorrow. Getting 1 unit PRBC ordered by ED. ED also started IV Protonix. NPO after midnight, CLD ok now.  ? ?  ?Review of Systems: As mentioned in the history of present illness. All other systems reviewed and are negative. ?Past Medical History:  ?Diagnosis Date  ? Arthritis   ? Dermatitis   ? GERD (gastroesophageal reflux disease)   ? Hyperlipidemia   ? Hypertension   ? Pre-diabetes   ? ?Past Surgical History:  ?Procedure Laterality Date  ? APPENDECTOMY  age 47  ? BREAST BIOPSY Right 05/08/2020  ? BENIGN INTRADUCTAL PAPILLOMA   ? BREAST LUMPECTOMY WITH RADIOFREQUENCY TAG IDENTIFICATION Right 06/07/2020  ? Procedure: BREAST LUMPECTOMY WITH RADIOFREQUENCY TAG IDENTIFICATION;  Surgeon: Olean Ree, MD;  Location: [facility] ORS;  Service: General;  Laterality: Right;  ? DILATION AND CURETTAGE OF UTERUS  age 40  ? MYOMECTOMY  2001  ? ?Social History:  reports that she quit smoking about 19 years ago. Her smoking use included cigarettes.  She smoked an average of .25 packs per day. She has never used smokeless tobacco. She reports current alcohol use. She reports current drug use. Drug: Marijuana. ? ?No Known Allergies ? ?Family History  ?Problem Relation Age of Onset  ? Breast cancer Neg Hx   ? ? ?Prior to Admission medications   ?Medication Sig Start Date End Date Taking? Authorizing Provider  ?diphenhydrAMINE (BENADRYL) 25 MG tablet Take 25 mg by mouth daily as needed for allergies.    [provider]  ?diphenhydrAMINE HCl, Sleep, (ZZZQUIL) 25 MG CAPS Take 50 mg by mouth at bedtime as needed (sleep).    [provider]  ?hydrochlorothiazide (HYDRODIURIL) 12.5 MG tablet Take 12.5 mg by mouth daily. 03/19/20   [provider]  ?ketoconazole (NIZORAL) 2 % cream Apply 1 application topically daily as needed (rash).  04/17/20   [provider]  ?lisinopril (ZESTRIL) 40 MG tablet Take 40 mg by mouth daily.  05/23/20   [provider]  ?lovastatin (MEVACOR) 40 MG tablet Take 40 mg by mouth at bedtime.     [provider]  ?omeprazole (PRILOSEC) 20 MG capsule Take 20 mg by mouth daily.  03/19/20 03/19/21  [provider]  ?triamcinolone cream (KENALOG) 0.5 % Apply 1 application topically 2 (two) times daily as needed (rash).  05/02/20   [provider]  ?vitamin E 180 MG (400 UNITS) capsule Take 400 Units by mouth daily.    [provider]  ? ? ?Physical Exam: ?Vitals:  ? 12/04/21 1405 12/04/21 1708  ?BP: 138/62 (!) 107/93  ?  Pulse: 99 91  ?Resp: 19 (!) 22  ?Temp: 98.8 ?F (37.1 ?C)   ?SpO2: 100% 100%  ?Constitutional:  ?VSS, see nurse notes ?General Appearance: alert, well-developed, well-nourished, NAD ?Eyes: ?Normal lids and conjunctive, non-icteric sclera ?Ears, Nose, Mouth, Throat: ?Normal appearance ?Neck: ?No masses, trachea midline ?Respiratory: ?Normal respiratory effort ?Breath sounds normal, no wheeze/rhonchi/rales ?Cardiovascular: ?S1/S2 normal, no murmur/rub/gallop  auscultated ?No lower extremity edema ?Gastrointestinal: ?Nontender, no masses ?No hepatomegaly, no splenomegaly ?No hernia appreciated ?Musculoskeletal:  ?No clubbing/cyanosis of digits ?Neurological: ?No cranial nerve deficit on limited exam ?Motor intact and symmetric ?Psychiatric: ?Normal judgment/insight ?Normal mood and affect ? ?Data Reviewed: ?Results for orders placed or performed during the hospital encounter of 12/04/21 (from the past 24 hour(s))  ?Comprehensive metabolic panel     Status: Abnormal  ? Collection Time: 12/04/21  2:05 PM  ?Result Value Ref Range  ? Sodium 134 (L) 135 - 145 mmol/L  ? Potassium 3.8 3.5 - 5.1 mmol/L  ? Chloride 100 98 - 111 mmol/L  ? CO2 25 22 - 32 mmol/L  ? Glucose, Bld 163 (H) 70 - 99 mg/dL  ? BUN 21 8 - 23 mg/dL  ? Creatinine, Ser 1.30 (H) 0.44 - 1.00 mg/dL  ? Calcium 8.8 (L) 8.9 - 10.3 mg/dL  ? Total Protein 7.1 6.5 - 8.1 g/dL  ? Albumin 3.9 3.5 - 5.0 g/dL  ? AST 16 15 - 41 U/L  ? ALT 16 0 - 44 U/L  ? Alkaline Phosphatase 68 38 - 126 U/L  ? Total Bilirubin 0.3 0.3 - 1.2 mg/dL  ? GFR, Estimated 46 (L) >60 mL/min  ? Anion gap 9 5 - 15  ?CBC     Status: Abnormal  ? Collection Time: 12/04/21  2:05 PM  ?Result Value Ref Range  ? WBC 14.6 (H) 4.0 - 10.5 K/uL  ? RBC 2.64 (L) 3.87 - 5.11 MIL/uL  ? Hemoglobin 6.3 (L) 12.0 - 15.0 g/dL  ? HCT 21.2 (L) 36.0 - 46.0 %  ? MCV 80.3 80.0 - 100.0 fL  ? MCH 23.9 (L) 26.0 - 34.0 pg  ? MCHC 29.7 (L) 30.0 - 36.0 g/dL  ? RDW 22.5 (H) 11.5 - 15.5 %  ? Platelets 429 (H) 150 - 400 K/uL  ? nRBC 0.1 0.0 - 0.2 %  ?Type and screen [facility]     Status: None (Preliminary result)  ? Collection Time: 12/04/21  2:05 PM  ?Result Value Ref Range  ? ABO/RH(D) A POS   ? Antibody Screen NEG   ? Sample Expiration 12/07/2021,2359   ? Unit Number [id number]   ? Blood Component Type RED CELLS,LR   ? Unit division 00   ? Status of Unit ALLOCATED   ? Transfusion Status OK TO TRANSFUSE   ? Crossmatch Result    ?  Compatible ?Performed at  [facility], [address], [place], [place] [address] ?  ?Protime-INR - (order if Patient is taking Coumadin / Warfarin)     Status: None  ? Collection Time: 12/04/21  2:05 PM  ?Result Value Ref Range  ? Prothrombin Time 13.4 11.4 - 15.2 seconds  ? INR 1.0 0.8 - 1.2  ?Prepare RBC (crossmatch)     Status: None (Preliminary result)  ? Collection Time: 12/04/21  3:50 PM  ?Result Value Ref Range  ? Order Confirmation PENDING   ?Troponin I (High Sensitivity)     Status: None  ? Collection Time: 12/04/21  5:16 PM  ?Result Value  Ref Range  ? Troponin I (High Sensitivity) 5 <18 ng/L  ?ABO/Rh     Status: None  ? Collection Time: 12/04/21  5:32 PM  ?Result Value Ref Range  ? ABO/RH(D)    ?  A POS ?Performed at [facility], [address], [place], [place] [address] ?  ? ? ? ? ? ?Hospital Course:  ?Presents to ED 12/04/21 w/ abnormal labs, abd discomfort. Pt has been off PPI at home, reports mild abdominal discomfort, had episodes of non bloody emesis last week, followed by dark/black stool. SOB/weakness, sought care outpatient and Hgb 6.2 so came to ED. ED started 1 unit PRBC, IV protonix. Consulted GI - Dr Jasmine Little will see pt, plan for likely EGD tomorrow. Getting 1 unit PRBC ordered by ED. ED also started IV Protonix. NPO after midnight, CLD ok now.  ? ? ? ? ?Assessment and Plan: ?* GI bleed ?Pt has been off PPI at home, reports mild abdominal discomfort, had episodes of non bloody emesis last week, followed by dark/black stool. SOB/weakness, sought care outpatient and Hgb 6.2 so came to ED. ED started 1 unit PRBC, IV protonix.  ?Consulted GI - communicated via secure chat w/ Dr Jasmine Little 12/04/21 5:55 PM  ?Getting 1 unit PRBC ordered by ED  ?ED also started IV Protonix ?NPO after midnight, CLD ok now  ? ? ?Renal insufficiency ?Creatinine at baseline ?Continue to monitor  ? ?Iron deficiency anemia secondary to inadequate dietary iron intake ?Pt on iron but dark stool is a new issue ? ?History of  esophagogastroduodenoscopy (EGD) ?08/2019 (+)gastric lipoma ? ?Type 2 diabetes mellitus with hyperlipidemia ([place]) ?Pt reports controlled on Metformin ?A1C was 6.8 on 10/04/21 ?Hold meformin for now ?Monitor Glc w/ AM la

## 2021-12-05 ENCOUNTER — Inpatient Hospital Stay: Payer: Medicare HMO | Admitting: Anesthesiology

## 2021-12-05 ENCOUNTER — Encounter
Admission: EM | Disposition: A | Discharge status: Home / Self Care | Payer: Self-pay | Source: Home / Self Care | Attending: Osteopathic Medicine

## 2021-12-05 ENCOUNTER — Encounter: Payer: Self-pay | Admitting: Osteopathic Medicine

## 2021-12-05 DIAGNOSIS — K317 Polyp of stomach and duodenum: Secondary | ICD-10-CM | POA: Diagnosis not present

## 2021-12-05 DIAGNOSIS — I10 Essential (primary) hypertension: Secondary | ICD-10-CM | POA: Diagnosis not present

## 2021-12-05 DIAGNOSIS — Z9889 Other specified postprocedural states: Secondary | ICD-10-CM | POA: Diagnosis not present

## 2021-12-05 DIAGNOSIS — K219 Gastro-esophageal reflux disease without esophagitis: Secondary | ICD-10-CM | POA: Diagnosis not present

## 2021-12-05 DIAGNOSIS — K922 Gastrointestinal hemorrhage, unspecified: Secondary | ICD-10-CM | POA: Diagnosis not present

## 2021-12-05 HISTORY — PX: ESOPHAGOGASTRODUODENOSCOPY: SHX5428

## 2021-12-05 LAB — PREPARE RBC (CROSSMATCH)

## 2021-12-05 LAB — CBC
HCT: 21.5 % — ABNORMAL LOW (ref 36.0–46.0)
HCT: 26.1 % — ABNORMAL LOW (ref 36.0–46.0)
Hemoglobin: 6.7 g/dL — ABNORMAL LOW (ref 12.0–15.0)
Hemoglobin: 8.4 g/dL — ABNORMAL LOW (ref 12.0–15.0)
MCH: 25.5 pg — ABNORMAL LOW (ref 26.0–34.0)
MCH: 26.3 pg (ref 26.0–34.0)
MCHC: 31.2 g/dL (ref 30.0–36.0)
MCHC: 32.2 g/dL (ref 30.0–36.0)
MCV: 81.7 fL (ref 80.0–100.0)
MCV: 81.8 fL (ref 80.0–100.0)
Platelets: 343 10*3/uL (ref 150–400)
Platelets: 342 10*3/uL (ref 150–400)
RBC: 2.63 MIL/uL — ABNORMAL LOW (ref 3.87–5.11)
RBC: 3.19 MIL/uL — ABNORMAL LOW (ref 3.87–5.11)
RDW: 22 % — ABNORMAL HIGH (ref 11.5–15.5)
RDW: 20.9 % — ABNORMAL HIGH (ref 11.5–15.5)
WBC: 12.9 10*3/uL — ABNORMAL HIGH (ref 4.0–10.5)
WBC: 11.1 10*3/uL — ABNORMAL HIGH (ref 4.0–10.5)
nRBC: 0 % (ref 0.0–0.2)
nRBC: 0.2 % (ref 0.0–0.2)

## 2021-12-05 LAB — BASIC METABOLIC PANEL
Anion gap: 6 (ref 5–15)
BUN: 19 mg/dL (ref 8–23)
CO2: 25 mmol/L (ref 22–32)
Calcium: 8.6 mg/dL — ABNORMAL LOW (ref 8.9–10.3)
Chloride: 105 mmol/L (ref 98–111)
Creatinine, Ser: 1.19 mg/dL — ABNORMAL HIGH (ref 0.44–1.00)
GFR, Estimated: 51 mL/min — ABNORMAL LOW (ref >60)
Glucose, Bld: 119 mg/dL — ABNORMAL HIGH (ref 70–99)
Potassium: 3.8 mmol/L (ref 3.5–5.1)
Sodium: 136 mmol/L (ref 135–145)

## 2021-12-05 LAB — HIV ANTIBODY (ROUTINE TESTING W REFLEX): HIV Screen 4th Generation wRfx: NONREACTIVE

## 2021-12-05 SURGERY — EGD (ESOPHAGOGASTRODUODENOSCOPY)
Anesthesia: General

## 2021-12-05 MED ORDER — LIDOCAINE HCL (CARDIAC) PF 100 MG/5ML IV SOSY
PREFILLED_SYRINGE | INTRAVENOUS | Status: DC | PRN
  Administered 2021-12-05: 50 mg via INTRAVENOUS

## 2021-12-05 MED ORDER — PROPOFOL 500 MG/50ML IV EMUL
INTRAVENOUS | Status: AC
  Filled 2021-12-05: qty 50

## 2021-12-05 MED ORDER — PROPOFOL 10 MG/ML IV BOLUS
INTRAVENOUS | Status: DC | PRN
  Administered 2021-12-05 (×2): 20 mg via INTRAVENOUS
  Administered 2021-12-05: 50 mg via INTRAVENOUS

## 2021-12-05 MED ORDER — LIDOCAINE HCL (PF) 2 % IJ SOLN
INTRAMUSCULAR | Status: AC
  Filled 2021-12-05: qty 5

## 2021-12-05 MED ORDER — SODIUM CHLORIDE 0.9% IV SOLUTION: Freq: Once | INTRAVENOUS | Status: AC

## 2021-12-05 MED ORDER — SODIUM CHLORIDE 0.9 % IV SOLN: INTRAVENOUS | Status: DC

## 2021-12-05 MED ORDER — PROPOFOL 500 MG/50ML IV EMUL
INTRAVENOUS | Status: DC | PRN
Start: 2021-12-05 — End: 2021-12-05
  Administered 2021-12-05: 150 ug/kg/min via INTRAVENOUS

## 2021-12-05 MED ORDER — SUCCINYLCHOLINE CHLORIDE 200 MG/10ML IV SOSY
PREFILLED_SYRINGE | INTRAVENOUS | Status: AC
  Filled 2021-12-05: qty 10

## 2021-12-05 NOTE — Care Management (Addendum)
Pre-rounds chart review and preliminary plan / goals for today: ?Significant overnight events per chart: none ?Significant new findings on labs/other diagnostics: Hgb only minimally better 6.3 --> 6.7 ?Dispo plan: inpatient today ?Pending/Plan: GI to see today, given H/H not improving much will plan for another 1 unit PRBC (for anesthesia ideally Hgb <7) ? ?Please feel free to reach out via secure chat in Epic for non-urgent issues  ?Please page for urgent matters! ? ?This note will be updated after rounds to full progress note / discharge note.  ? ?

## 2021-12-05 NOTE — Anesthesia Procedure Notes (Signed)
Date/Time: 12/05/2021 3:40 PM ?Performed by: Johnna Acosta, CRNA ?Pre-anesthesia Checklist: Emergency Drugs available, Suction available, Patient identified, Patient being monitored and Timeout performed ?Patient Re-evaluated:Patient Re-evaluated prior to induction ?Oxygen Delivery Method: Nasal cannula ?Preoxygenation: Pre-oxygenation with 100% oxygen ?Induction Type: IV induction ? ? ? ? ?

## 2021-12-05 NOTE — Progress Notes (Signed)
?Progress Note ? ? ?PatientAdler Little GGE:[id number] DOB: 05-14-1956 DOA: 12/04/2021     1 ?DOS: the patient was seen and examined on 12/05/2021 ?  ?Brief hospital course: ?Presents to ED 12/04/21 w/ abnormal labs, abd discomfort. Pt has been off PPI at home, reports mild abdominal discomfort, had episodes of non bloody emesis last week, followed by dark/black stool. SOB/weakness, sought care outpatient and Hgb 6.2 so came to ED. ED started 1 unit PRBC, IV protonix. Consulted GI - Dr Alice Reichert will see pt, plan for likely EGD tomorrow. Getting 1 unit PRBC ordered by ED. ED also started IV Protonix. NPO after midnight, CLD ok now.  ? ?Consultants: ?Gastroenterology ? ?Procedures: ?EGD planned for 12/05/2021 ? ? ? ? ?Assessment and Plan: ?* GI bleed ?Pt has been off PPI at home, reports mild abdominal discomfort, had episodes of non bloody emesis last week, followed by dark/black stool. SOB/weakness, sought care outpatient and Hgb 6.2 so came to ED. ED started 1 unit PRBC, IV protonix.  ?S/p 2 units PRBC ?IV Protonix ?GI following, plan for EGD today 12/05/21  ?Pending EGD results, and pending AM CBC, may be able to discharge tomorrow if Hgb stable ? ? ?Renal insufficiency ?Creatinine at baseline ?Continue to monitor  ? ?Iron deficiency anemia secondary to inadequate dietary iron intake ?Pt on iron but dark stool is a new issue ?Monitor CBC, see above for GI bleed  ? ?History of esophagogastroduodenoscopy (EGD) ?08/2019 (+)gastric lipoma ?EGD 12/05/21  ? ?Type 2 diabetes mellitus with hyperlipidemia ([place]) ?Pt reports controlled on Metformin ?A1C was 6.8 on 10/04/21 ?Hold meformin for now ?Monitor Glc w/ AM labs  ?Consider SSI if hyperglycemic but given CLD/NPO will monitor for now  ? ?Pure hypercholesterolemia ?Continue home lovastatin ? ?Essential hypertension ?Continue home meds w/ lisinopril and HCT ? ?GERD without esophagitis ?PPI IV for now ?Resume PO PPI on discharge ?GI following  ? ? ? ? ?   ? ?Subjective: Patient seen and examined at bedside in hospital room, doing well other than feeling hungry.  Second unit PRBC running now ? ?Physical Exam: ?Vitals:  ? 12/05/21 1147 12/05/21 1202 12/05/21 1203 12/05/21 1449  ?BP:  140/63 140/63 (!) 118/56  ?Pulse:  88 82 85  ?Resp: '15 15  17  '$ ?Temp:  99.1 ?F (37.3 ?C)  98 ?F (36.7 ?C)  ?TempSrc:  Oral    ?SpO2:  100% 100% 100%  ? ?Constitutional:  ?VSS, see nurse notes ?General Appearance: alert, well-developed, well-nourished, NAD ?Eyes: ?Normal lids and conjunctive, non-icteric sclera ?Ears, Nose, Mouth, Throat: ?Normal appearance ?Neck: ?No masses, trachea midline ?Respiratory: ?Normal respiratory effort ?Breath sounds normal, no wheeze/rhonchi/rales ?Cardiovascular: ?S1/S2 normal, no murmur/rub/gallop auscultated ?No lower extremity edema ?Musculoskeletal:  ?No clubbing/cyanosis of digits ?Psychiatric: ?Normal judgment/insight ?Normal mood and affect ? ? ?Data Reviewed: ?Results for orders placed or performed during the hospital encounter of 12/04/21 (from the past 24 hour(s))  ?Prepare RBC (crossmatch)     Status: None  ? Collection Time: 12/04/21  3:50 PM  ?Result Value Ref Range  ? Order Confirmation    ?  ORDER PROCESSED BY BLOOD BANK ?Performed at [facility], [address] [place], [place] [address] ?  ?Troponin I (High Sensitivity)     Status: None  ? Collection Time: 12/04/21  5:16 PM  ?Result Value Ref Range  ? Troponin I (High Sensitivity) 5 <18 ng/L  ?ABO/Rh     Status: None  ? Collection Time: 12/04/21  5:32 PM  ?Result Value  Ref Range  ? ABO/RH(D)    ?  A POS ?Performed at [facility], [address], [place], [place] [address] ?  ?HIV Antibody (routine testing w rflx)     Status: None  ? Collection Time: 12/04/21  9:49 PM  ?Result Value Ref Range  ? HIV Screen 4th Generation wRfx Non Reactive Non Reactive  ?Troponin I (High Sensitivity)     Status: None  ? Collection Time: 12/04/21  9:49 PM  ?Result Value Ref Range  ?  Troponin I (High Sensitivity) 5 <18 ng/L  ?Basic metabolic panel     Status: Abnormal  ? Collection Time: 12/05/21  4:43 AM  ?Result Value Ref Range  ? Sodium 136 135 - 145 mmol/L  ? Potassium 3.8 3.5 - 5.1 mmol/L  ? Chloride 105 98 - 111 mmol/L  ? CO2 25 22 - 32 mmol/L  ? Glucose, Bld 119 (H) 70 - 99 mg/dL  ? BUN 19 8 - 23 mg/dL  ? Creatinine, Ser 1.19 (H) 0.44 - 1.00 mg/dL  ? Calcium 8.6 (L) 8.9 - 10.3 mg/dL  ? GFR, Estimated 51 (L) >60 mL/min  ? Anion gap 6 5 - 15  ?CBC     Status: Abnormal  ? Collection Time: 12/05/21  4:43 AM  ?Result Value Ref Range  ? WBC 12.9 (H) 4.0 - 10.5 K/uL  ? RBC 2.63 (L) 3.87 - 5.11 MIL/uL  ? Hemoglobin 6.7 (L) 12.0 - 15.0 g/dL  ? HCT 21.5 (L) 36.0 - 46.0 %  ? MCV 81.7 80.0 - 100.0 fL  ? MCH 25.5 (L) 26.0 - 34.0 pg  ? MCHC 31.2 30.0 - 36.0 g/dL  ? RDW 22.0 (H) 11.5 - 15.5 %  ? Platelets 343 150 - 400 K/uL  ? nRBC 0.0 0.0 - 0.2 %  ?Prepare RBC (crossmatch)     Status: None  ? Collection Time: 12/05/21 10:00 AM  ?Result Value Ref Range  ? Order Confirmation    ?  ORDER PROCESSED BY BLOOD BANK ?Performed at [facility], [address] [place],  [address] ?  ?CBC     Status: Abnormal  ? Collection Time: 12/05/21  2:42 PM  ?Result Value Ref Range  ? WBC 11.1 (H) 4.0 - 10.5 K/uL  ? RBC 3.19 (L) 3.87 - 5.11 MIL/uL  ? Hemoglobin 8.4 (L) 12.0 - 15.0 g/dL  ? HCT 26.1 (L) 36.0 - 46.0 %  ? MCV 81.8 80.0 - 100.0 fL  ? MCH 26.3 26.0 - 34.0 pg  ? MCHC 32.2 30.0 - 36.0 g/dL  ? RDW 20.9 (H) 11.5 - 15.5 %  ? Platelets 342 150 - 400 K/uL  ? nRBC 0.2 0.0 - 0.2 %  ? ? ? ? ?Family Communication: None at this time ? ?Disposition: ?Status is: Inpatient ?Remains inpatient appropriate because requiring procedure to evaluate for GI bleed, need to monitor vital signs/bleeding status and monitor labs ? Planned Discharge Destination: Home ? ? ? ? ?Author: ?Emeterio Reeve, DO ?12/05/2021 3:12 PM ? ?For on call review [email or web address].  ?

## 2021-12-05 NOTE — Anesthesia Postprocedure Evaluation (Signed)
Anesthesia Post Note ? ?Patient: [name] ? ?Procedure(s) Performed: ESOPHAGOGASTRODUODENOSCOPY (EGD) ? ?Patient location during evaluation: Endoscopy ?Anesthesia Type: General ?Level of consciousness: awake and alert ?Pain management: pain level controlled ?Vital Signs Assessment: post-procedure vital signs reviewed and stable ?Respiratory status: spontaneous breathing, nonlabored ventilation, respiratory function stable and patient connected to nasal cannula oxygen ?Cardiovascular status: blood pressure returned to baseline and stable ?Postop Assessment: no apparent nausea or vomiting ?Anesthetic complications: no ? ? ?No notable events documented. ? ? ?Last Vitals:  ?Vitals:  ? 12/05/21 1616 12/05/21 1714  ?BP: (!) 109/45 (!) 179/63  ?Pulse:  83  ?Resp:  18  ?Temp:  37.2 ?C  ?SpO2:  100%  ?  ?Last Pain:  ?Vitals:  ? 12/05/21 1626  ?TempSrc:   ?PainSc: 0-No pain  ? ? ?  ?  ?  ?  ?  ?  ? ?Jasmine Little ? ? ? ? ?

## 2021-12-05 NOTE — H&P (View-Only) (Signed)
? ? ?GI Inpatient Consult Note ? ?Reason for Consult: UGIB ?  ?Attending Requesting Consult: Dr. Emeterio Reeve, DO ? ?History of Present Illness: ?Jasmine Little is a 66 y.o. female seen for evaluation of UGIB at the request of hospitalist - Dr. Emeterio Reeve. Patient has a PMH of HTN, HLD, T2DM, OA, morbid obesity, IDA, and dermatitis. She presented to the [facility] ED yesterday afternoon after being seen in the [facility] by Grayland Ormond, PA-C, for generalized weakness, fatigue, mild upper abdominal discomfort, and melanotic stool off and on for the past few months. Hemoglobin was found to be 6.2 and she was advised to go to the ED for concern of upper GI bleed. Upon presentation to the [facility] ED yesterday afternoon, labs were significant for hemoglobin 6.3, MCV 80, WBC 14.6K, sodium 134, potassium 3.8, serum creatinine 1.3, and BUN 21. She was transfused 1 unit pRBCs and started on IV Protonix. GI consulted for further evaluation and management.  ? ?Patient seen and examined this morning resting comfortably in hospital bed. No acute events overnight. Hemoglobin only improved to 6.7 after 1 unit pRBCs. She reports she saw her PCP in early March for complaints of fatigue. Labs six weeks ago showed IDA with hemoglobin 9.0, MCV 76, ferritin 4, iron 21. Six months ago her hemoglobin was 11.4. She was started on oral supplement and has been taking this daily. She has been off of her PPI and not taking it consistently. She is not using frequent NSAIDs because she knows these mess up her stomach. She has been complaining of some generalized postprandial dyspepsia symptoms over the past week with mild epigastric discomfort and heartburn. She has been supplementing with Pepto-Bismol OTC. She reports that over the past few months her stools have been off an on black and tarry appearing. She denies any frank hematochezia. She denies any nausea, vomiting, esophageal dysphagia, odynophagia, or early satiety  symptoms. She had EGD/CSY performed by Dr. Alice Reichert 06/2019 which showed normal esophagus, large submucosal mass with no bleeding in lesser curvature of stomach with biopsy inconclusive, along with evidence of H pylori gastritis. She had subsequent EUS by Dr. Mont Dutton which confirmed gastric lipoma with no surveillance needed.  ? ? ?Summary of GI Procedures: ? ?- CSY: 06/2019 - moderate diverticulosis in the sigmoid and descending colon. Mild diverticulosis in the ascending colon. Non-bleeding internal hemorrhoids. ?- EGD: 06/2019 -normal esophagus, large submucosal, partially circumferential mass with no bleeding no stigmata of recent bleeding found in the lesser curvature of the stomach with biopsy showing not a significant amount of submucosal tissue for review. There was evidence of H. pylori gastritis. ?- EUS: 08/2019 - gastric lipoma ? ? ? ?Past Medical History:  ?Past Medical History:  ?Diagnosis Date  ? Arthritis   ? Dermatitis   ? GERD (gastroesophageal reflux disease)   ? Hyperlipidemia   ? Hypertension   ? Pre-diabetes   ?  ?Problem List: ?Patient Active Problem List  ? Diagnosis Date Noted  ? GI bleed 12/04/2021  ? History of esophagogastroduodenoscopy (EGD) 12/04/2021  ? Iron deficiency anemia secondary to inadequate dietary iron intake 10/17/2021  ? Renal insufficiency 10/17/2021  ? Intraductal papilloma of right breast   ? History of Helicobacter pylori infection 05/25/2020  ? Lipoma of stomach 10/06/2019  ? GERD without esophagitis 01/06/2018  ? Essential hypertension 01/06/2018  ? Other atopic dermatitis 11/26/2016  ? Pure hypercholesterolemia 11/26/2016  ? Type 2 diabetes mellitus with hyperlipidemia ([place]) 11/26/2016  ? Morbid obesity with BMI of  45.0-49.9, adult ([place]) 11/20/2016  ?  ?Past Surgical History: ?Past Surgical History:  ?Procedure Laterality Date  ? APPENDECTOMY  age 7  ? BREAST BIOPSY Right 05/08/2020  ? BENIGN INTRADUCTAL PAPILLOMA   ? BREAST LUMPECTOMY WITH RADIOFREQUENCY TAG  IDENTIFICATION Right 06/07/2020  ? Procedure: BREAST LUMPECTOMY WITH RADIOFREQUENCY TAG IDENTIFICATION;  Surgeon: Olean Ree, MD;  Location: [facility] ORS;  Service: General;  Laterality: Right;  ? DILATION AND CURETTAGE OF UTERUS  age 41  ? MYOMECTOMY  2001  ?  ?Allergies: ?No Known Allergies  ?Home Medications: ?Medications Prior to Admission  ?Medication Sig Dispense Refill Last Dose  ? atorvastatin (LIPITOR) 40 MG tablet Take 40 mg by mouth at bedtime.   12/03/2021 at 2000  ? hydrochlorothiazide (HYDRODIURIL) 12.5 MG tablet Take 12.5 mg by mouth daily.   12/04/2021 at 1315  ? lisinopril (ZESTRIL) 40 MG tablet Take 40 mg by mouth daily.    12/04/2021 at 1315  ? metFORMIN (GLUCOPHAGE-XR) 500 MG 24 hr tablet Take 500 mg by mouth every evening.   12/03/2021 at 1700  ? pantoprazole (PROTONIX) 40 MG tablet Take 40 mg by mouth daily.   12/04/2021 at 1315  ? sucralfate (CARAFATE) 1 g tablet Take 1 g by mouth 4 (four) times daily.   12/04/2021 at 1215  ? diphenhydrAMINE (BENADRYL) 25 MG tablet Take 25 mg by mouth daily as needed for allergies.   prn  ? diphenhydrAMINE HCl, Sleep, (ZZZQUIL) 25 MG CAPS Take 50 mg by mouth at bedtime as needed (sleep).   prn  ? vitamin E 180 MG (400 UNITS) capsule Take 400 Units by mouth daily. (Patient not taking: Reported on 12/04/2021)   Not Taking  ? ?Home medication reconciliation was completed with the patient.  ? ?Scheduled Inpatient Medications: ?  ? sodium chloride   Intravenous Once  ? [START ON 12/08/2021] pantoprazole  40 mg Intravenous Q12H  ? ? ?Continuous Inpatient Infusions: ?  ? sodium chloride    ? pantoprazole 8 mg/hr (12/05/21 0345)  ? ? ?PRN Inpatient Medications:  ? ? ?Family History: ?family history is not on file.  The patient's family history is negative for inflammatory bowel disorders, GI malignancy, or solid organ transplantation. ? ?Social History:  ? reports that she quit smoking about 19 years ago. Her smoking use included cigarettes. She smoked an average of .25 packs  per day. She has never used smokeless tobacco. She reports current alcohol use. She reports current drug use. Drug: Marijuana. The patient denies ETOH, tobacco, or drug use.  ? ?Review of Systems: ?Constitutional: Weight is stable.  ?Eyes: No changes in vision. ?ENT: No oral lesions, sore throat.  ?GI: see HPI.  ?Heme/Lymph: No easy bruising.  ?CV: No chest pain.  ?GU: No hematuria.  ?Integumentary: No rashes.  ?Neuro: No headaches.  ?Psych: No depression/anxiety.  ?Endocrine: No heat/cold intolerance.  ?Allergic/Immunologic: No urticaria.  ?Resp: No cough, SOB.  ?Musculoskeletal: No joint swelling.  ?  ?Physical Examination: ?BP 116/66 (BP Location: Left Arm)   Pulse 73   Temp 98.4 ?F (36.9 ?C) (Oral)   Resp 19   SpO2 100%  ?Gen: NAD, alert and oriented x 4 ?HEENT: PEERLA, EOMI, ?Neck: supple, no JVD or thyromegaly ?Chest: CTA bilaterally, no wheezes, crackles, or other adventitious sounds ?CV: RRR, no m/g/c/r ?Abd: soft, NT, ND, +BS in all four quadrants; no HSM, guarding, ridigity, or rebound tenderness ?Ext: no edema, well perfused with 2+ pulses, ?Skin: no rash or lesions noted ?Lymph: no LAD ? ?Data: ?  Lab Results  ?Component Value Date  ? WBC 12.9 (H) 12/05/2021  ? HGB 6.7 (L) 12/05/2021  ? HCT 21.5 (L) 12/05/2021  ? MCV 81.7 12/05/2021  ? PLT 343 12/05/2021  ? ?Recent Labs  ?Lab 12/04/21 ?1405 12/05/21 ?0443  ?HGB 6.3* 6.7*  ? ?Lab Results  ?Component Value Date  ? NA 136 12/05/2021  ? K 3.8 12/05/2021  ? CL 105 12/05/2021  ? CO2 25 12/05/2021  ? BUN 19 12/05/2021  ? CREATININE 1.19 (H) 12/05/2021  ? ?Lab Results  ?Component Value Date  ? ALT 16 12/04/2021  ? AST 16 12/04/2021  ? ALKPHOS 68 12/04/2021  ? BILITOT 0.3 12/04/2021  ? ?Recent Labs  ?Lab 12/04/21 ?1405  ?INR 1.0  ? ?Assessment/Plan: ? ?66 y/o AA female with a PMH of HTN, HLD, prediabetes, OA, and dermatitis presented to the [facility] ED yesterday afternoon for chief complaint of mild upper abdominal discomfort, low hemoglobin, and melanotic stool.   ? ?Melena/UGIB/Epigastric pain - clinical presentation c/w upper GI bleed with ddx including peptic ulcer disease, gastritis, erosive esophagitis, duodenitis, AVMs, gastric lipoma, GAVE, etc. Hemoglobin 6.3 upon pr

## 2021-12-05 NOTE — Anesthesia Preprocedure Evaluation (Signed)
Anesthesia Evaluation  ?Patient identified by MRN, date of birth, ID band ?Patient awake ? ?General Assessment Comment: ? ?No nausea vomiting today, feels well from that standpoint. Feels much better after blood transfusion. ? ?Reviewed: ?Allergy & Precautions, NPO status , Patient's Chart, lab work & pertinent test results ? ?History of Anesthesia Complications ?Negative for: history of anesthetic complications ? ?Airway ?Mallampati: II ? ?TM Distance: >3 FB ?Neck ROM: Full ? ? ? Dental ? ?(+) Teeth Intact ?  ?Pulmonary ?neg pulmonary ROS, neg sleep apnea, neg COPD, Patient abstained from smoking.Not current smoker, former smoker,  ?  ?Pulmonary exam normal ?breath sounds clear to auscultation ? ? ? ? ? ? Cardiovascular ?Exercise Tolerance: Good ?METShypertension, Pt. on medications ?(-) CAD, (-) Past MI and (-) CHF (-) dysrhythmias (-) Valvular Problems/Murmurs ?Rhythm:Regular Rate:Normal ?- Systolic murmurs ? ?  ?Neuro/Psych ?neg Seizures negative neurological ROS ? negative psych ROS  ? GI/Hepatic ?GERD  Medicated and Controlled,(+)  ?  ? substance abuse ? marijuana use, Daily marijuana use ?  ?Endo/Other  ?diabetes (borderline, diet controlled)Morbid obesity ? Renal/GU ?negative Renal ROS  ? ?  ?Musculoskeletal ? ? Abdominal ?(+) + obese,   ?Peds ? Hematology ? ?(+) Blood dyscrasia, anemia ,   ?Anesthesia Other Findings ?Past Medical History: ?No date: Arthritis ?No date: Dermatitis ?No date: GERD (gastroesophageal reflux disease) ?No date: Hyperlipidemia ?No date: Hypertension ?No date: Pre-diabetes ? Reproductive/Obstetrics ? ?  ? ? ? ? ? ? ? ? ? ? ? ? ? ?  ?  ? ? ? ? ? ? ? ? ?Anesthesia Physical ? ?Anesthesia Plan ? ?ASA: 3 ? ?Anesthesia Plan: General  ? ?Post-op Pain Management: Minimal or no pain anticipated  ? ?Induction: Intravenous ? ?PONV Risk Score and Plan: 3 and Ondansetron, TIVA and Propofol infusion ? ?Airway Management Planned: Natural Airway ? ?Additional  Equipment: None ? ?Intra-op Plan:  ? ?Post-operative Plan:  ? ?Informed Consent: I have reviewed the patients History and Physical, chart, labs and discussed the procedure including the risks, benefits and alternatives for the proposed anesthesia with the patient or authorized representative who has indicated his/her understanding and acceptance.  ? ? ? ?Dental advisory given ? ?Plan Discussed with: CRNA and Surgeon ? ?Anesthesia Plan Comments: (Discussed risks of anesthesia with patient, including possibility of difficulty with spontaneous ventilation under anesthesia necessitating airway intervention, PONV, and rare risks such as cardiac or respiratory or neurological events, and allergic reactions. Discussed the role of CRNA in patient's perioperative care. Patient understands. ?Patient informed about increased incidence of above perioperative risk due to high BMI. Patient understands. ?)  ? ? ? ? ? ? ?Anesthesia Quick Evaluation ? ?

## 2021-12-05 NOTE — Transfer of Care (Signed)
Immediate Anesthesia Transfer of Care Note ? ?Patient: Jasmine Little ? ?Procedure(s) Performed: ESOPHAGOGASTRODUODENOSCOPY (EGD) ? ?Patient Location: PACU ? ?Anesthesia Type:General ? ?Level of Consciousness: awake, alert  and oriented ? ?Airway & Oxygen Therapy: Patient Spontanous Breathing and Patient connected to nasal cannula oxygen ? ?Post-op Assessment: Report given to RN and Post -op Vital signs reviewed and stable ? ?Post vital signs: Reviewed and stable ? ?Last Vitals:  ?Vitals Value Taken Time  ?BP 117/51 12/05/21 1606  ?Temp    ?Pulse 93 12/05/21 1606  ?Resp 15 12/05/21 1606  ?SpO2 100 % 12/05/21 1606  ? ? ?Last Pain:  ?Vitals:  ? 12/05/21 1526  ?TempSrc: Temporal  ?PainSc: 0-No pain  ?   ? ?  ? ?Complications: No notable events documented. ?

## 2021-12-05 NOTE — TOC Initial Note (Signed)
Transition of Care (TOC) - Initial/Assessment Note  ? ? ?Patient Details  ?Name: Jasmine Little ?MRN: [id number] ?Date of Birth: [birth year] ? ?Transition of Care (TOC) CM/SW Contact:    ?Beverly Sessions, RN ?Phone Number: ?12/05/2021, 9:28 AM ? ?Clinical Narrative:                 ? ? ?Transition of Care (TOC) Screening Note ? ? ?Patient Details  ?Name: Jasmine Little ?Date of Birth: [birth year] ? ? ?Transition of Care (TOC) CM/SW Contact:    ?Beverly Sessions, RN ?Phone Number: ?12/05/2021, 9:28 AM ? ? ? ?Transition of Care Department [facility]) has reviewed patient and no TOC needs have been identified at this time. We will continue to monitor patient advancement through interdisciplinary progression rounds. If new patient transition needs arise, please place a TOC consult. ? ? ?  ?  ? ? ?Patient Goals and CMS Choice ?  ?  ?  ? ?Expected Discharge Plan and Services ?  ?  ?  ?  ?  ?                ?  ?  ?  ?  ?  ?  ?  ?  ?  ?  ? ?Prior Living Arrangements/Services ?  ?  ?  ?       ?  ?  ?  ?  ? ?Activities of Daily Living ?  ?  ? ?Permission Sought/Granted ?  ?  ?   ?   ?   ?   ? ?Emotional Assessment ?  ?  ?  ?  ?  ?  ? ?Admission diagnosis:  GI bleed [K92.2] ?Upper GI bleed [K92.2] ?Symptomatic anemia [D64.9] ?Patient Active Problem List  ? Diagnosis Date Noted  ? GI bleed 12/04/2021  ? History of esophagogastroduodenoscopy (EGD) 12/04/2021  ? Iron deficiency anemia secondary to inadequate dietary iron intake 10/17/2021  ? Renal insufficiency 10/17/2021  ? Intraductal papilloma of right breast   ? History of Helicobacter pylori infection 05/25/2020  ? Lipoma of stomach 10/06/2019  ? GERD without esophagitis 01/06/2018  ? Essential hypertension 01/06/2018  ? Other atopic dermatitis 11/26/2016  ? Pure hypercholesterolemia 11/26/2016  ? Type 2 diabetes mellitus with hyperlipidemia ([place]) 11/26/2016  ? Morbid obesity with BMI of 45.0-49.9, adult ([place]) 11/20/2016  ? ?PCP:  Dion Body, MD ?Pharmacy:    ?[facility] [address] ?[place] [address] ?Phone: [phone] Fax: [phone] ? ? ? ? ?Social Determinants of Health (SDOH) Interventions ?  ? ?Readmission Risk Interventions ?   ? View : No data to display.  ?  ?  ?  ? ? ? ?

## 2021-12-05 NOTE — Op Note (Signed)
[facility] ?Gastroenterology ?Patient Name: Jasmine Little ?Procedure Date: 12/05/2021 3:39 PM ?MRN: [id number] ?Account #: [id number] ?Date of Birth: October 30, 1955 ?Admit Type: Inpatient ?Age: 66 ?Room: [facility] ENDO ROOM 3 ?Gender: Female ?Note Status: Finalized ?Instrument Name: Upper Endoscope [id number] ?Procedure:             Upper GI endoscopy ?Indications:           Iron deficiency anemia secondary to chronic blood  ?                       loss, Melena ?Providers:             Benay Pike. Alice Reichert MD, MD ?Referring MD:          Dion Body (Referring MD) ?Medicines:             Propofol per Anesthesia ?Complications:         No immediate complications. Estimated blood loss:  ?                       Minimal. ?Procedure:             Pre-Anesthesia Assessment: ?                       - The risks and benefits of the procedure and the  ?                       sedation options and risks were discussed with the  ?                       patient. All questions were answered and informed  ?                       consent was obtained. ?                       - Patient identification and proposed procedure were  ?                       verified prior to the procedure by the nurse. The  ?                       procedure was verified in the procedure room. ?                       - ASA Grade Assessment: III - A patient with severe  ?                       systemic disease. ?                       - After reviewing the risks and benefits, the patient  ?                       was deemed in satisfactory condition to undergo the  ?                       procedure. ?                       After obtaining informed consent, the  endoscope was  ?                       passed under direct vision. Throughout the procedure,  ?                       the patient's blood pressure, pulse, and oxygen  ?                       saturations were monitored continuously. The Endoscope  ?                       was introduced  through the mouth, and advanced to the  ?                       third part of duodenum. The upper GI endoscopy was  ?                       accomplished without difficulty. The patient tolerated  ?                       the procedure well. ?Findings: ?     The esophagus was normal. ?     A medium-sized, polypoid, non-circumferential mass with oozing bleeding  ?     and stigmata of recent bleeding was found in the gastric body. For  ?     hemostasis, one hemostatic clip was successfully placed (MR  ?     conditional). There was no bleeding at the end of the procedure.  ?     Biopsies were taken with a cold forceps for histology. To prevent  ?     bleeding after the biopsy, one hemostatic clip was successfully placed  ?     (MR conditional). There was no bleeding at the end of the procedure. ?     No other significant abnormalities were identified in a careful  ?     examination of the stomach. ?     The examined duodenum was normal. ?     The exam was otherwise without abnormality. ?Impression:            - Normal esophagus. ?                       - Rule out malignancy, gastric tumor in the gastric  ?                       body. Clip (MR conditional) was placed. Biopsied. ?                       - Normal examined duodenum. ?                       - The examination was otherwise normal. ?Recommendation:        - Return patient to hospital ward for observation. ?                       - Full liquid diet. ?                       - Observe patient's clinical course. ?                       -  Await pathology results. ?                       - The findings and recommendations were discussed with  ?                       the patient. ?Procedure Code(s):     --- Professional --- ?                       65784, 59, Esophagogastroduodenoscopy, flexible,  ?                       transoral; with control of bleeding, any method ?                       69629, Esophagogastroduodenoscopy, flexible,  ?                        transoral; with biopsy, single or multiple ?Diagnosis Code(s):     --- Professional --- ?                       K92.1, Melena (includes Hematochezia) ?                       D50.0, Iron deficiency anemia secondary to blood loss  ?                       (chronic) ?                       D49.0, Neoplasm of unspecified behavior of digestive  ?                       system ?CPT copyright 2019 American Medical Association. All rights reserved. ?The codes documented in this report are preliminary and upon coder review may  ?be revised to meet current compliance requirements. ?Efrain Sella MD, MD ?12/05/2021 4:04:32 PM ?This report has been signed electronically. ?Number of Addenda: 0 ?Note Initiated On: 12/05/2021 3:39 PM ?Estimated Blood Loss:  Estimated blood loss was minimal. ?     [facility] ?

## 2021-12-05 NOTE — Interval H&P Note (Signed)
History and Physical Interval Note: ? ?12/05/2021 ?3:14 PM ? ?Jasmine Little  has presented today for surgery, with the diagnosis of Melena, upper GI bleed, iron-deficiency anemia, epigastric pain.  The various methods of treatment have been discussed with the patient and family. After consideration of risks, benefits and other options for treatment, the patient has consented to  Procedure(s): ?ESOPHAGOGASTRODUODENOSCOPY (EGD) (N/A) as a surgical intervention.  The patient's history has been reviewed, patient examined, no change in status, stable for surgery.  I have reviewed the patient's chart and labs.  Questions were answered to the patient's satisfaction.   ? ? ?[place], [place] ? ? ?

## 2021-12-05 NOTE — Interval H&P Note (Signed)
History and Physical Interval Note: ? ?12/05/2021 ?2:10 PM ? ?Jasmine Little  has presented today for surgery, with the diagnosis of Melena, upper GI bleed, iron-deficiency anemia, epigastric pain.  The various methods of treatment have been discussed with the patient and family. After consideration of risks, benefits and other options for treatment, the patient has consented to  Procedure(s): ?ESOPHAGOGASTRODUODENOSCOPY (EGD) (N/A) as a surgical intervention.  The patient's history has been reviewed, patient examined, no change in status, stable for surgery.  I have reviewed the patient's chart and labs.  Questions were answered to the patient's satisfaction.   ? ? ?[place], [place] ? ? ?

## 2021-12-05 NOTE — Consult Note (Signed)
? ? ?GI Inpatient Consult Note ? ?Reason for Consult: UGIB ?  ?Attending Requesting Consult: Dr. Emeterio Reeve, DO ? ?History of Present Illness: ?Jasmine Little is a 66 y.o. female seen for evaluation of UGIB at the request of hospitalist - Dr. Emeterio Reeve. Patient has a PMH of HTN, HLD, T2DM, OA, morbid obesity, IDA, and dermatitis. She presented to the [facility] ED yesterday afternoon after being seen in the [facility] by Grayland Ormond, PA-C, for generalized weakness, fatigue, mild upper abdominal discomfort, and melanotic stool off and on for the past few months. Hemoglobin was found to be 6.2 and she was advised to go to the ED for concern of upper GI bleed. Upon presentation to the [facility] ED yesterday afternoon, labs were significant for hemoglobin 6.3, MCV 80, WBC 14.6K, sodium 134, potassium 3.8, serum creatinine 1.3, and BUN 21. She was transfused 1 unit pRBCs and started on IV Protonix. GI consulted for further evaluation and management.  ? ?Patient seen and examined this morning resting comfortably in hospital bed. No acute events overnight. Hemoglobin only improved to 6.7 after 1 unit pRBCs. She reports she saw her PCP in early March for complaints of fatigue. Labs six weeks ago showed IDA with hemoglobin 9.0, MCV 76, ferritin 4, iron 21. Six months ago her hemoglobin was 11.4. She was started on oral supplement and has been taking this daily. She has been off of her PPI and not taking it consistently. She is not using frequent NSAIDs because she knows these mess up her stomach. She has been complaining of some generalized postprandial dyspepsia symptoms over the past week with mild epigastric discomfort and heartburn. She has been supplementing with Pepto-Bismol OTC. She reports that over the past few months her stools have been off an on black and tarry appearing. She denies any frank hematochezia. She denies any nausea, vomiting, esophageal dysphagia, odynophagia, or early satiety  symptoms. She had EGD/CSY performed by Dr. Alice Reichert 06/2019 which showed normal esophagus, large submucosal mass with no bleeding in lesser curvature of stomach with biopsy inconclusive, along with evidence of H pylori gastritis. She had subsequent EUS by Dr. Mont Dutton which confirmed gastric lipoma with no surveillance needed.  ? ? ?Summary of GI Procedures: ? ?- CSY: 06/2019 - moderate diverticulosis in the sigmoid and descending colon. Mild diverticulosis in the ascending colon. Non-bleeding internal hemorrhoids. ?- EGD: 06/2019 -normal esophagus, large submucosal, partially circumferential mass with no bleeding no stigmata of recent bleeding found in the lesser curvature of the stomach with biopsy showing not a significant amount of submucosal tissue for review. There was evidence of H. pylori gastritis. ?- EUS: 08/2019 - gastric lipoma ? ? ? ?Past Medical History:  ?Past Medical History:  ?Diagnosis Date  ? Arthritis   ? Dermatitis   ? GERD (gastroesophageal reflux disease)   ? Hyperlipidemia   ? Hypertension   ? Pre-diabetes   ?  ?Problem List: ?Patient Active Problem List  ? Diagnosis Date Noted  ? GI bleed 12/04/2021  ? History of esophagogastroduodenoscopy (EGD) 12/04/2021  ? Iron deficiency anemia secondary to inadequate dietary iron intake 10/17/2021  ? Renal insufficiency 10/17/2021  ? Intraductal papilloma of right breast   ? History of Helicobacter pylori infection 05/25/2020  ? Lipoma of stomach 10/06/2019  ? GERD without esophagitis 01/06/2018  ? Essential hypertension 01/06/2018  ? Other atopic dermatitis 11/26/2016  ? Pure hypercholesterolemia 11/26/2016  ? Type 2 diabetes mellitus with hyperlipidemia ([place]) 11/26/2016  ? Morbid obesity with BMI of  45.0-49.9, adult ([place]) 11/20/2016  ?  ?Past Surgical History: ?Past Surgical History:  ?Procedure Laterality Date  ? APPENDECTOMY  age 68  ? BREAST BIOPSY Right 05/08/2020  ? BENIGN INTRADUCTAL PAPILLOMA   ? BREAST LUMPECTOMY WITH RADIOFREQUENCY TAG  IDENTIFICATION Right 06/07/2020  ? Procedure: BREAST LUMPECTOMY WITH RADIOFREQUENCY TAG IDENTIFICATION;  Surgeon: Olean Ree, MD;  Location: [facility] ORS;  Service: General;  Laterality: Right;  ? DILATION AND CURETTAGE OF UTERUS  age 49  ? MYOMECTOMY  2001  ?  ?Allergies: ?No Known Allergies  ?Home Medications: ?Medications Prior to Admission  ?Medication Sig Dispense Refill Last Dose  ? atorvastatin (LIPITOR) 40 MG tablet Take 40 mg by mouth at bedtime.   12/03/2021 at 2000  ? hydrochlorothiazide (HYDRODIURIL) 12.5 MG tablet Take 12.5 mg by mouth daily.   12/04/2021 at 1315  ? lisinopril (ZESTRIL) 40 MG tablet Take 40 mg by mouth daily.    12/04/2021 at 1315  ? metFORMIN (GLUCOPHAGE-XR) 500 MG 24 hr tablet Take 500 mg by mouth every evening.   12/03/2021 at 1700  ? pantoprazole (PROTONIX) 40 MG tablet Take 40 mg by mouth daily.   12/04/2021 at 1315  ? sucralfate (CARAFATE) 1 g tablet Take 1 g by mouth 4 (four) times daily.   12/04/2021 at 1215  ? diphenhydrAMINE (BENADRYL) 25 MG tablet Take 25 mg by mouth daily as needed for allergies.   prn  ? diphenhydrAMINE HCl, Sleep, (ZZZQUIL) 25 MG CAPS Take 50 mg by mouth at bedtime as needed (sleep).   prn  ? vitamin E 180 MG (400 UNITS) capsule Take 400 Units by mouth daily. (Patient not taking: Reported on 12/04/2021)   Not Taking  ? ?Home medication reconciliation was completed with the patient.  ? ?Scheduled Inpatient Medications: ?  ? sodium chloride   Intravenous Once  ? [START ON 12/08/2021] pantoprazole  40 mg Intravenous Q12H  ? ? ?Continuous Inpatient Infusions: ?  ? sodium chloride    ? pantoprazole 8 mg/hr (12/05/21 0345)  ? ? ?PRN Inpatient Medications:  ? ? ?Family History: ?family history is not on file.  The patient's family history is negative for inflammatory bowel disorders, GI malignancy, or solid organ transplantation. ? ?Social History:  ? reports that she quit smoking about 19 years ago. Her smoking use included cigarettes. She smoked an average of .25 packs  per day. She has never used smokeless tobacco. She reports current alcohol use. She reports current drug use. Drug: Marijuana. The patient denies ETOH, tobacco, or drug use.  ? ?Review of Systems: ?Constitutional: Weight is stable.  ?Eyes: No changes in vision. ?ENT: No oral lesions, sore throat.  ?GI: see HPI.  ?Heme/Lymph: No easy bruising.  ?CV: No chest pain.  ?GU: No hematuria.  ?Integumentary: No rashes.  ?Neuro: No headaches.  ?Psych: No depression/anxiety.  ?Endocrine: No heat/cold intolerance.  ?Allergic/Immunologic: No urticaria.  ?Resp: No cough, SOB.  ?Musculoskeletal: No joint swelling.  ?  ?Physical Examination: ?BP 116/66 (BP Location: Left Arm)   Pulse 73   Temp 98.4 ?F (36.9 ?C) (Oral)   Resp 19   SpO2 100%  ?Gen: NAD, alert and oriented x 4 ?HEENT: PEERLA, EOMI, ?Neck: supple, no JVD or thyromegaly ?Chest: CTA bilaterally, no wheezes, crackles, or other adventitious sounds ?CV: RRR, no m/g/c/r ?Abd: soft, NT, ND, +BS in all four quadrants; no HSM, guarding, ridigity, or rebound tenderness ?Ext: no edema, well perfused with 2+ pulses, ?Skin: no rash or lesions noted ?Lymph: no LAD ? ?Data: ?  Lab Results  ?Component Value Date  ? WBC 12.9 (H) 12/05/2021  ? HGB 6.7 (L) 12/05/2021  ? HCT 21.5 (L) 12/05/2021  ? MCV 81.7 12/05/2021  ? PLT 343 12/05/2021  ? ?Recent Labs  ?Lab 12/04/21 ?1405 12/05/21 ?0443  ?HGB 6.3* 6.7*  ? ?Lab Results  ?Component Value Date  ? NA 136 12/05/2021  ? K 3.8 12/05/2021  ? CL 105 12/05/2021  ? CO2 25 12/05/2021  ? BUN 19 12/05/2021  ? CREATININE 1.19 (H) 12/05/2021  ? ?Lab Results  ?Component Value Date  ? ALT 16 12/04/2021  ? AST 16 12/04/2021  ? ALKPHOS 68 12/04/2021  ? BILITOT 0.3 12/04/2021  ? ?Recent Labs  ?Lab 12/04/21 ?1405  ?INR 1.0  ? ?Assessment/Plan: ? ?66 y/o AA female with a PMH of HTN, HLD, prediabetes, OA, and dermatitis presented to the [facility] ED yesterday afternoon for chief complaint of mild upper abdominal discomfort, low hemoglobin, and melanotic stool.   ? ?Melena/UGIB/Epigastric pain - clinical presentation c/w upper GI bleed with ddx including peptic ulcer disease, gastritis, erosive esophagitis, duodenitis, AVMs, gastric lipoma, GAVE, etc. Hemoglobin 6.3 upon pr

## 2021-12-06 ENCOUNTER — Encounter: Payer: Self-pay | Admitting: Internal Medicine

## 2021-12-06 DIAGNOSIS — K219 Gastro-esophageal reflux disease without esophagitis: Secondary | ICD-10-CM | POA: Diagnosis not present

## 2021-12-06 DIAGNOSIS — I10 Essential (primary) hypertension: Secondary | ICD-10-CM | POA: Diagnosis not present

## 2021-12-06 DIAGNOSIS — Z9889 Other specified postprocedural states: Secondary | ICD-10-CM | POA: Diagnosis not present

## 2021-12-06 DIAGNOSIS — K922 Gastrointestinal hemorrhage, unspecified: Secondary | ICD-10-CM | POA: Diagnosis not present

## 2021-12-06 LAB — CBC
HCT: 25.5 % — ABNORMAL LOW (ref 36.0–46.0)
Hemoglobin: 8.2 g/dL — ABNORMAL LOW (ref 12.0–15.0)
MCH: 26.2 pg (ref 26.0–34.0)
MCHC: 32.2 g/dL (ref 30.0–36.0)
MCV: 81.5 fL (ref 80.0–100.0)
Platelets: 370 10*3/uL (ref 150–400)
RBC: 3.13 MIL/uL — ABNORMAL LOW (ref 3.87–5.11)
RDW: 20.7 % — ABNORMAL HIGH (ref 11.5–15.5)
WBC: 11.3 10*3/uL — ABNORMAL HIGH (ref 4.0–10.5)
nRBC: 0 % (ref 0.0–0.2)

## 2021-12-06 LAB — TYPE AND SCREEN
ABO/RH(D): A POS
Antibody Screen: NEGATIVE
Unit division: 0
Unit division: 0

## 2021-12-06 LAB — BPAM RBC
Blood Product Expiration Date: 202305202359
Blood Product Expiration Date: 202305222359
ISSUE DATE / TIME: 202304261823
ISSUE DATE / TIME: 202304271136
Unit Type and Rh: 6200
Unit Type and Rh: 6200

## 2021-12-06 NOTE — Discharge Summary (Signed)
Physician Discharge Summary  ?Jasmine Little CBU:[id number] DOB: 04/17/1956 DOA: 12/04/2021 ? ?PCP: Dion Body, MD ? ?Admit date: 12/04/2021 ?Discharge date: 12/06/2021 ? ?Admitted From: home ?Disposition:  home ? ?Recommendations for Outpatient Follow-up:  ?Follow up with PCP in 1-2 weeks ?Please obtain labs/tests: CBC in 1-2 weeks ?Please follow up on the following pending results: GI to follow EGD biopsy results  ? ?Home Health:none ?Equipment/Devices:none ? ?Discharge Condition:good  ?CODE STATUS:FULL  ?Diet recommendation: Heart Healthy / Carb Modified  ? ?Brief/Interim Summary: ?Presents to ED 12/04/21 w/ abnormal labs, abd discomfort. Pt has been off PPI at home, reports mild abdominal discomfort, had episodes of non bloody emesis last week, followed by dark/black stool. SOB/weakness, sought care outpatient and Hgb 6.2 so came to ED. ED started 1 unit PRBC, IV protonix. Consulted GI- Dr Alice Reichert likely EGD tomorrow. Getting 1 unit PRBC ordered by ED. ED also started IV Protonix. NPO after midnight ?12/05/21 required additional unit of PRBC prior to EGD, see GI EGD report for full details but in brief EGD demonstrated normal esophagus, medium polypoid noncircumferential mass with oozing bleeding and stigmata of recent bleeding found in gastric body, biopsy taken, hemostatic clip successfully placed, no bleeding at end of procedure. ?12/06/2021, patient continued to do well overnight, diet was advanced, she was eager to go home and was felt medically stable and safe to be discharged with close follow-up outpatient ? ?Discharge Diagnoses:  ?Principal Problem: ?  GI bleed ?Active Problems: ?  GERD without esophagitis ?  Essential hypertension ?  Pure hypercholesterolemia ?  Type 2 diabetes mellitus with hyperlipidemia (Jasmine Little) ?  History of Helicobacter pylori infection ?  History of esophagogastroduodenoscopy (EGD) ?  Iron deficiency anemia secondary to inadequate dietary iron intake ?  Renal  insufficiency ? ? ?GI bleed ?Pt has been off PPI at home, reports mild abdominal discomfort, had episodes of non bloody emesis last week, followed by dark/black stool. SOB/weakness, sought care outpatient and Hgb 6.2 so came to ED. ED started 1 unit PRBC, IV protonix.  ?S/p 2 units PRBC in hospital  ?IV Protonix ?GI following, EGD 12/05/21 demonstrated normal esophagus, medium polypoid noncircumferential mass with oozing bleeding and stigmata of recent bleeding found in gastric body, biopsy taken, hemostatic clip successfully placed, no bleeding at end of procedure. ? ?GERD without esophagitis ?PPI IV for now ?Resume PO PPI on discharge ?GI following  ? ?Essential hypertension ?Continue home meds w/ lisinopril and HCT ? ?Pure hypercholesterolemia ?Continue home lovastatin ? ?Type 2 diabetes mellitus with hyperlipidemia (Jasmine Little) ?Pt reports controlled on Metformin ?A1C was 6.8 on 10/04/21 ?Hold meformin for now ?Monitor Glc w/ AM labs  ?Consider SSI if hyperglycemic but given CLD/NPO will monitor for now  ? ?History of esophagogastroduodenoscopy (EGD) ?08/2019 (+)gastric lipoma ?EGD 12/05/21  ? ?Iron deficiency anemia secondary to inadequate dietary iron intake ?Pt on iron but dark stool is a new issue ?Monitor CBC, see above for GI bleed  ? ?Renal insufficiency ?Creatinine at baseline ?Continue to monitor  ? ? ? ? ?Discharge Instructions ? ?Discharge Instructions   ? ? Diet - low sodium heart healthy   Complete by: As directed ?  ? Discharge instructions   Complete by: As directed ?  ? KEEP APPOINTMENT WITH GASTROENTEROLOGY TO FOLLOW UP AS DIRECTED. IF UNABLE TO SEE THEM IN 102 WEEKS, WOULD CALL YOUR PRIMARY CARE TO SCHEDULE A HOSPITAL FOLLOW UP APPOINTMENT TO RECHECK BLOOD COUNTS AND MAKE SURE THEY ARE STABLE. IF YOU EXPERIENCE SEVERE ABDOMINAL PAIN, ANY VOMITING  OF BLOODY/DARK MATERIAL, ANY DARK/BLACK/TARRY/MAROON STOOL PLEASE SEEK MEDICAL CARE ASAP!  ? Increase activity slowly   Complete by: As directed ?  ? ?   ? ?Allergies as of 12/06/2021   ?No Known Allergies ?  ? ?  ?Medication List  ?  ? ?STOP taking these medications   ? ?vitamin E 180 MG (400 UNITS) capsule ?  ? ?  ? ?TAKE these medications   ? ?atorvastatin 40 MG tablet ?Commonly known as: LIPITOR ?Take 40 mg by mouth at bedtime. ?  ?diphenhydrAMINE 25 MG tablet ?Commonly known as: BENADRYL ?Take 25 mg by mouth daily as needed for allergies. ?  ?hydrochlorothiazide 12.5 MG tablet ?Commonly known as: HYDRODIURIL ?Take 12.5 mg by mouth daily. ?  ?lisinopril 40 MG tablet ?Commonly known as: ZESTRIL ?Take 40 mg by mouth daily. ?  ?metFORMIN 500 MG 24 hr tablet ?Commonly known as: GLUCOPHAGE-XR ?Take 500 mg by mouth every evening. ?  ?pantoprazole 40 MG tablet ?Commonly known as: PROTONIX ?Take 40 mg by mouth daily. ?  ?sucralfate 1 g tablet ?Commonly known as: CARAFATE ?Take 1 g by mouth 4 (four) times daily. ?  ?ZzzQuil 25 MG Caps ?Generic drug: diphenhydrAMINE HCl (Sleep) ?Take 50 mg by mouth at bedtime as needed (sleep). ?  ? ?  ? ? ? ?No Known Allergies ? ?Consultations: ?GI ? ? ?Procedures/Studies: ?EGD 12/05/21 demonstrated normal esophagus, medium polypoid noncircumferential mass with oozing bleeding and stigmata of recent bleeding found in gastric body, biopsy taken, hemostatic clip successfully placed, no bleeding at end of procedure. ? ? ? ?Subjective: Patient seen and examined this morning, resting comfortably on hospital floor in bed, no apparent distress.  She is very eager to be discharged home, denies any pain, reports she is tolerating diet, no pain or any additional concerns. ? ? ?Discharge Exam: ?Vitals:  ? 12/06/21 0607 12/06/21 0750  ?BP: (!) 121/46 (!) 124/48  ?Pulse: 77 75  ?Resp: 18 18  ?Temp: 98.2 ?F (36.8 ?C) 98.2 ?F (36.8 ?C)  ?SpO2: 100% 99%  ? ?Vitals:  ? 12/05/21 1714 12/05/21 1957 12/06/21 1751 12/06/21 0750  ?BP: (!) 179/63 (!) 131/54 (!) 121/46 (!) 124/48  ?Pulse: 83 88 77 75  ?Resp: '18 16 18 18  '$ ?Temp: 99 ?F (37.2 ?C) 98.2 ?F (36.8 ?C)  98.2 ?F (36.8 ?C) 98.2 ?F (36.8 ?C)  ?TempSrc:  Oral Oral   ?SpO2: 100% 100% 100% 99%  ?Weight:      ?Height:      ? ? ?General: Pt is alert, awake, not in acute distress ?Cardiovascular: RRR, S1/S2 +, no rubs, no gallops ?Respiratory: CTA bilaterally, no wheezing, no rhonchi ?Abdominal: Soft, NT, ND, bowel sounds + ?Extremities: no edema, no cyanosis ? ? ? ? ?The results of significant diagnostics from this hospitalization (including imaging, microbiology, ancillary and laboratory) are listed below for reference.   ? ? ?Microbiology: ?No results found for this or any previous visit (from the past 240 hour(s)).  ? ?Labs: ?BNP (last 3 results) ?No results for input(s): BNP in the last 8760 hours. ?Basic Metabolic Panel: ?Recent Labs  ?Lab 12/04/21 ?1405 12/05/21 ?0443  ?NA 134* 136  ?K 3.8 3.8  ?CL 100 105  ?CO2 25 25  ?GLUCOSE 163* 119*  ?BUN 21 19  ?CREATININE 1.30* 1.19*  ?CALCIUM 8.8* 8.6*  ? ?Liver Function Tests: ?Recent Labs  ?Lab 12/04/21 ?1405  ?AST 16  ?ALT 16  ?ALKPHOS 68  ?BILITOT 0.3  ?PROT 7.1  ?ALBUMIN 3.9  ? ?No  results for input(s): LIPASE, AMYLASE in the last 168 hours. ?No results for input(s): AMMONIA in the last 168 hours. ?CBC: ?Recent Labs  ?Lab 12/04/21 ?1405 12/05/21 ?0443 12/05/21 ?1442 12/06/21 ?0402  ?WBC 14.6* 12.9* 11.1* 11.3*  ?HGB 6.3* 6.7* 8.4* 8.2*  ?HCT 21.2* 21.5* 26.1* 25.5*  ?MCV 80.3 81.7 81.8 81.5  ?PLT 429* 343 342 370  ? ?Cardiac Enzymes: ?No results for input(s): CKTOTAL, CKMB, CKMBINDEX, TROPONINI in the last 168 hours. ?BNP: ?Invalid input(s): POCBNP ?CBG: ?No results for input(s): GLUCAP in the last 168 hours. ?D-Dimer ?No results for input(s): DDIMER in the last 72 hours. ?Hgb A1c ?No results for input(s): HGBA1C in the last 72 hours. ?Lipid Profile ?No results for input(s): CHOL, HDL, LDLCALC, TRIG, CHOLHDL, LDLDIRECT in the last 72 hours. ?Thyroid function studies ?No results for input(s): TSH, T4TOTAL, T3FREE, THYROIDAB in the last 72 hours. ? ?Invalid input(s):  FREET3 ?Anemia work up ?No results for input(s): VITAMINB12, FOLATE, FERRITIN, TIBC, IRON, RETICCTPCT in the last 72 hours. ?Urinalysis ?No results found for: COLORURINE, APPEARANCEUR, [employer] [facility], [place], [place], [place], BILI

## 2021-12-09 LAB — SURGICAL PATHOLOGY

## 2022-01-03 DIAGNOSIS — E785 Hyperlipidemia, unspecified: Secondary | ICD-10-CM | POA: Diagnosis not present

## 2022-01-03 DIAGNOSIS — E7800 Pure hypercholesterolemia, unspecified: Secondary | ICD-10-CM | POA: Diagnosis not present

## 2022-01-03 DIAGNOSIS — E1169 Type 2 diabetes mellitus with other specified complication: Secondary | ICD-10-CM | POA: Diagnosis not present

## 2022-01-03 DIAGNOSIS — I10 Essential (primary) hypertension: Secondary | ICD-10-CM | POA: Diagnosis not present

## 2022-01-03 DIAGNOSIS — Z862 Personal history of diseases of the blood and blood-forming organs and certain disorders involving the immune mechanism: Secondary | ICD-10-CM | POA: Diagnosis not present

## 2022-01-03 DIAGNOSIS — N289 Disorder of kidney and ureter, unspecified: Secondary | ICD-10-CM | POA: Diagnosis not present

## 2022-01-10 DIAGNOSIS — E1169 Type 2 diabetes mellitus with other specified complication: Secondary | ICD-10-CM | POA: Diagnosis not present

## 2022-01-10 DIAGNOSIS — E785 Hyperlipidemia, unspecified: Secondary | ICD-10-CM | POA: Diagnosis not present

## 2022-01-10 DIAGNOSIS — Z0000 Encounter for general adult medical examination without abnormal findings: Secondary | ICD-10-CM | POA: Diagnosis not present

## 2022-01-10 DIAGNOSIS — D509 Iron deficiency anemia, unspecified: Secondary | ICD-10-CM | POA: Diagnosis not present

## 2022-01-10 DIAGNOSIS — I10 Essential (primary) hypertension: Secondary | ICD-10-CM | POA: Diagnosis not present

## 2022-03-04 ENCOUNTER — Other Ambulatory Visit: Payer: Self-pay | Admitting: Family Medicine

## 2022-03-04 DIAGNOSIS — Z1231 Encounter for screening mammogram for malignant neoplasm of breast: Secondary | ICD-10-CM

## 2022-04-17 DIAGNOSIS — D175 Benign lipomatous neoplasm of intra-abdominal organs: Secondary | ICD-10-CM | POA: Diagnosis not present

## 2022-04-17 DIAGNOSIS — K922 Gastrointestinal hemorrhage, unspecified: Secondary | ICD-10-CM | POA: Diagnosis not present

## 2022-04-17 DIAGNOSIS — K219 Gastro-esophageal reflux disease without esophagitis: Secondary | ICD-10-CM | POA: Diagnosis not present

## 2022-05-09 DIAGNOSIS — E1169 Type 2 diabetes mellitus with other specified complication: Secondary | ICD-10-CM | POA: Diagnosis not present

## 2022-05-09 DIAGNOSIS — I10 Essential (primary) hypertension: Secondary | ICD-10-CM | POA: Diagnosis not present

## 2022-05-09 DIAGNOSIS — E785 Hyperlipidemia, unspecified: Secondary | ICD-10-CM | POA: Diagnosis not present

## 2022-05-09 DIAGNOSIS — D509 Iron deficiency anemia, unspecified: Secondary | ICD-10-CM | POA: Diagnosis not present

## 2022-05-26 DIAGNOSIS — E119 Type 2 diabetes mellitus without complications: Secondary | ICD-10-CM | POA: Diagnosis not present

## 2022-06-03 DIAGNOSIS — M25511 Pain in right shoulder: Secondary | ICD-10-CM | POA: Diagnosis not present

## 2022-06-03 DIAGNOSIS — L209 Atopic dermatitis, unspecified: Secondary | ICD-10-CM | POA: Diagnosis not present

## 2022-06-03 DIAGNOSIS — K8020 Calculus of gallbladder without cholecystitis without obstruction: Secondary | ICD-10-CM | POA: Diagnosis not present

## 2022-06-03 DIAGNOSIS — I10 Essential (primary) hypertension: Secondary | ICD-10-CM | POA: Diagnosis not present

## 2022-06-03 DIAGNOSIS — E785 Hyperlipidemia, unspecified: Secondary | ICD-10-CM | POA: Diagnosis not present

## 2022-06-03 DIAGNOSIS — K219 Gastro-esophageal reflux disease without esophagitis: Secondary | ICD-10-CM | POA: Diagnosis not present

## 2022-06-03 DIAGNOSIS — E1169 Type 2 diabetes mellitus with other specified complication: Secondary | ICD-10-CM | POA: Diagnosis not present

## 2022-06-05 ENCOUNTER — Other Ambulatory Visit: Payer: Self-pay | Admitting: Family Medicine

## 2022-06-05 DIAGNOSIS — E1169 Type 2 diabetes mellitus with other specified complication: Secondary | ICD-10-CM

## 2022-06-05 DIAGNOSIS — K8020 Calculus of gallbladder without cholecystitis without obstruction: Secondary | ICD-10-CM

## 2022-06-12 ENCOUNTER — Ambulatory Visit
Admission: RE | Admit: 2022-06-12 | Discharge: 2022-06-12 | Disposition: A | Discharge status: Home / Self Care | Payer: Medicare HMO | Source: Ambulatory Visit | Attending: Family Medicine | Admitting: Family Medicine

## 2022-06-12 DIAGNOSIS — K8020 Calculus of gallbladder without cholecystitis without obstruction: Secondary | ICD-10-CM

## 2022-06-12 DIAGNOSIS — E1169 Type 2 diabetes mellitus with other specified complication: Secondary | ICD-10-CM

## 2022-06-17 ENCOUNTER — Other Ambulatory Visit: Payer: Self-pay | Admitting: Family Medicine

## 2022-06-17 DIAGNOSIS — K8020 Calculus of gallbladder without cholecystitis without obstruction: Secondary | ICD-10-CM

## 2022-06-17 DIAGNOSIS — R142 Eructation: Secondary | ICD-10-CM

## 2022-06-17 DIAGNOSIS — R140 Abdominal distension (gaseous): Secondary | ICD-10-CM

## 2022-06-17 DIAGNOSIS — R1013 Epigastric pain: Secondary | ICD-10-CM

## 2022-06-25 ENCOUNTER — Ambulatory Visit
Admission: RE | Admit: 2022-06-25 | Discharge: 2022-06-25 | Disposition: A | Discharge status: Home / Self Care | Payer: Medicare HMO | Source: Ambulatory Visit | Attending: Family Medicine | Admitting: Family Medicine

## 2022-06-25 DIAGNOSIS — Z1231 Encounter for screening mammogram for malignant neoplasm of breast: Secondary | ICD-10-CM | POA: Diagnosis not present

## 2022-06-30 ENCOUNTER — Ambulatory Visit
Admission: RE | Admit: 2022-06-30 | Discharge: 2022-06-30 | Disposition: A | Discharge status: Home / Self Care | Payer: Medicare HMO | Source: Ambulatory Visit | Attending: Family Medicine | Admitting: Family Medicine

## 2022-06-30 DIAGNOSIS — K8020 Calculus of gallbladder without cholecystitis without obstruction: Secondary | ICD-10-CM | POA: Insufficient documentation

## 2022-06-30 DIAGNOSIS — R1011 Right upper quadrant pain: Secondary | ICD-10-CM | POA: Diagnosis not present

## 2022-06-30 DIAGNOSIS — R1013 Epigastric pain: Secondary | ICD-10-CM | POA: Insufficient documentation

## 2022-06-30 DIAGNOSIS — R142 Eructation: Secondary | ICD-10-CM | POA: Diagnosis not present

## 2022-06-30 DIAGNOSIS — R140 Abdominal distension (gaseous): Secondary | ICD-10-CM | POA: Diagnosis not present

## 2022-06-30 MED ORDER — TECHNETIUM TC 99M MEBROFENIN IV KIT
5.1400 | PACK | Freq: Once | INTRAVENOUS | Status: AC | PRN
  Administered 2022-06-30: 5.14 via INTRAVENOUS

## 2022-07-01 ENCOUNTER — Other Ambulatory Visit: Payer: Self-pay | Admitting: Family Medicine

## 2022-07-01 DIAGNOSIS — R928 Other abnormal and inconclusive findings on diagnostic imaging of breast: Secondary | ICD-10-CM

## 2022-07-01 DIAGNOSIS — N6489 Other specified disorders of breast: Secondary | ICD-10-CM

## 2022-07-21 ENCOUNTER — Ambulatory Visit
Admission: RE | Admit: 2022-07-21 | Discharge: 2022-07-21 | Disposition: A | Discharge status: Home / Self Care | Payer: Medicare HMO | Source: Ambulatory Visit | Attending: Family Medicine | Admitting: Family Medicine

## 2022-07-21 DIAGNOSIS — N6489 Other specified disorders of breast: Secondary | ICD-10-CM

## 2022-07-21 DIAGNOSIS — R928 Other abnormal and inconclusive findings on diagnostic imaging of breast: Secondary | ICD-10-CM | POA: Diagnosis not present

## 2022-07-24 DIAGNOSIS — R1013 Epigastric pain: Secondary | ICD-10-CM | POA: Diagnosis not present

## 2022-07-24 DIAGNOSIS — Z8719 Personal history of other diseases of the digestive system: Secondary | ICD-10-CM | POA: Diagnosis not present

## 2022-07-24 DIAGNOSIS — D175 Benign lipomatous neoplasm of intra-abdominal organs: Secondary | ICD-10-CM | POA: Diagnosis not present

## 2022-07-24 DIAGNOSIS — K219 Gastro-esophageal reflux disease without esophagitis: Secondary | ICD-10-CM | POA: Diagnosis not present

## 2022-07-31 DIAGNOSIS — Z0100 Encounter for examination of eyes and vision without abnormal findings: Secondary | ICD-10-CM | POA: Diagnosis not present

## 2022-09-19 DIAGNOSIS — E785 Hyperlipidemia, unspecified: Secondary | ICD-10-CM | POA: Diagnosis not present

## 2022-09-19 DIAGNOSIS — E7800 Pure hypercholesterolemia, unspecified: Secondary | ICD-10-CM | POA: Diagnosis not present

## 2022-09-19 DIAGNOSIS — N289 Disorder of kidney and ureter, unspecified: Secondary | ICD-10-CM | POA: Diagnosis not present

## 2022-09-19 DIAGNOSIS — I10 Essential (primary) hypertension: Secondary | ICD-10-CM | POA: Diagnosis not present

## 2022-09-19 DIAGNOSIS — E1169 Type 2 diabetes mellitus with other specified complication: Secondary | ICD-10-CM | POA: Diagnosis not present

## 2022-09-26 DIAGNOSIS — R21 Rash and other nonspecific skin eruption: Secondary | ICD-10-CM | POA: Diagnosis not present

## 2022-09-26 DIAGNOSIS — Z6841 Body Mass Index (BMI) 40.0 and over, adult: Secondary | ICD-10-CM | POA: Diagnosis not present

## 2022-09-26 DIAGNOSIS — E1169 Type 2 diabetes mellitus with other specified complication: Secondary | ICD-10-CM | POA: Diagnosis not present

## 2022-09-26 DIAGNOSIS — Z0000 Encounter for general adult medical examination without abnormal findings: Secondary | ICD-10-CM | POA: Diagnosis not present

## 2022-09-26 DIAGNOSIS — I10 Essential (primary) hypertension: Secondary | ICD-10-CM | POA: Diagnosis not present

## 2022-09-26 DIAGNOSIS — K219 Gastro-esophageal reflux disease without esophagitis: Secondary | ICD-10-CM | POA: Diagnosis not present

## 2022-09-26 DIAGNOSIS — E7800 Pure hypercholesterolemia, unspecified: Secondary | ICD-10-CM | POA: Diagnosis not present

## 2022-09-26 DIAGNOSIS — N289 Disorder of kidney and ureter, unspecified: Secondary | ICD-10-CM | POA: Diagnosis not present

## 2022-11-07 DIAGNOSIS — F1290 Cannabis use, unspecified, uncomplicated: Secondary | ICD-10-CM | POA: Diagnosis not present

## 2022-11-07 DIAGNOSIS — F1090 Alcohol use, unspecified, uncomplicated: Secondary | ICD-10-CM | POA: Diagnosis not present

## 2022-11-07 DIAGNOSIS — E785 Hyperlipidemia, unspecified: Secondary | ICD-10-CM | POA: Diagnosis not present

## 2022-11-07 DIAGNOSIS — R0789 Other chest pain: Secondary | ICD-10-CM | POA: Diagnosis not present

## 2022-11-07 DIAGNOSIS — I10 Essential (primary) hypertension: Secondary | ICD-10-CM | POA: Diagnosis not present

## 2022-11-07 DIAGNOSIS — K219 Gastro-esophageal reflux disease without esophagitis: Secondary | ICD-10-CM | POA: Diagnosis not present

## 2022-11-07 DIAGNOSIS — R1110 Vomiting, unspecified: Secondary | ICD-10-CM | POA: Diagnosis not present

## 2022-11-07 DIAGNOSIS — R000 Tachycardia, unspecified: Secondary | ICD-10-CM | POA: Diagnosis not present

## 2022-11-07 DIAGNOSIS — R0602 Shortness of breath: Secondary | ICD-10-CM | POA: Diagnosis not present

## 2022-11-07 DIAGNOSIS — R079 Chest pain, unspecified: Secondary | ICD-10-CM | POA: Diagnosis not present

## 2022-11-07 DIAGNOSIS — R197 Diarrhea, unspecified: Secondary | ICD-10-CM | POA: Diagnosis not present

## 2022-11-07 DIAGNOSIS — Z886 Allergy status to analgesic agent status: Secondary | ICD-10-CM | POA: Diagnosis not present

## 2022-11-07 DIAGNOSIS — E119 Type 2 diabetes mellitus without complications: Secondary | ICD-10-CM | POA: Diagnosis not present

## 2022-11-11 DIAGNOSIS — R197 Diarrhea, unspecified: Secondary | ICD-10-CM | POA: Diagnosis not present

## 2022-11-11 DIAGNOSIS — R112 Nausea with vomiting, unspecified: Secondary | ICD-10-CM | POA: Diagnosis not present

## 2022-11-21 ENCOUNTER — Ambulatory Visit: Payer: Medicare HMO

## 2022-11-21 DIAGNOSIS — K219 Gastro-esophageal reflux disease without esophagitis: Secondary | ICD-10-CM | POA: Diagnosis not present

## 2022-11-21 DIAGNOSIS — D131 Benign neoplasm of stomach: Secondary | ICD-10-CM | POA: Diagnosis not present

## 2022-11-21 DIAGNOSIS — K3189 Other diseases of stomach and duodenum: Secondary | ICD-10-CM | POA: Diagnosis not present

## 2022-11-21 DIAGNOSIS — R1013 Epigastric pain: Secondary | ICD-10-CM | POA: Diagnosis not present

## 2023-04-24 DIAGNOSIS — E1169 Type 2 diabetes mellitus with other specified complication: Secondary | ICD-10-CM | POA: Diagnosis not present

## 2023-04-24 DIAGNOSIS — E785 Hyperlipidemia, unspecified: Secondary | ICD-10-CM | POA: Diagnosis not present

## 2023-05-01 DIAGNOSIS — I10 Essential (primary) hypertension: Secondary | ICD-10-CM | POA: Diagnosis not present

## 2023-05-01 DIAGNOSIS — N289 Disorder of kidney and ureter, unspecified: Secondary | ICD-10-CM | POA: Diagnosis not present

## 2023-05-01 DIAGNOSIS — L209 Atopic dermatitis, unspecified: Secondary | ICD-10-CM | POA: Diagnosis not present

## 2023-05-01 DIAGNOSIS — K219 Gastro-esophageal reflux disease without esophagitis: Secondary | ICD-10-CM | POA: Diagnosis not present

## 2023-05-01 DIAGNOSIS — L918 Other hypertrophic disorders of the skin: Secondary | ICD-10-CM | POA: Diagnosis not present

## 2023-05-01 DIAGNOSIS — Z0000 Encounter for general adult medical examination without abnormal findings: Secondary | ICD-10-CM | POA: Diagnosis not present

## 2023-05-01 DIAGNOSIS — E119 Type 2 diabetes mellitus without complications: Secondary | ICD-10-CM | POA: Diagnosis not present

## 2023-05-01 DIAGNOSIS — E7800 Pure hypercholesterolemia, unspecified: Secondary | ICD-10-CM | POA: Diagnosis not present

## 2023-05-12 DIAGNOSIS — E119 Type 2 diabetes mellitus without complications: Secondary | ICD-10-CM | POA: Diagnosis not present

## 2023-05-12 DIAGNOSIS — E785 Hyperlipidemia, unspecified: Secondary | ICD-10-CM | POA: Diagnosis not present

## 2023-05-19 ENCOUNTER — Other Ambulatory Visit: Payer: Self-pay | Admitting: Family Medicine

## 2023-05-19 DIAGNOSIS — Z1231 Encounter for screening mammogram for malignant neoplasm of breast: Secondary | ICD-10-CM

## 2023-05-23 IMAGING — MG MM DIGITAL SCREENING BILAT W/ TOMO AND CAD
6 of 12 series · 6 of 36 positions shown · non-contrast
Comparison: Previous exam(s).

CLINICAL DATA: Screening.

EXAM:
DIGITAL SCREENING BILATERAL MAMMOGRAM WITH TOMOSYNTHESIS AND CAD
TECHNIQUE: Bilateral screening digital craniocaudal and mediolateral oblique
mammograms were obtained. Bilateral screening digital breast
tomosynthesis was performed. The images were evaluated with
computer-aided detection.

[R MLO synth-2D (1 of 2)]
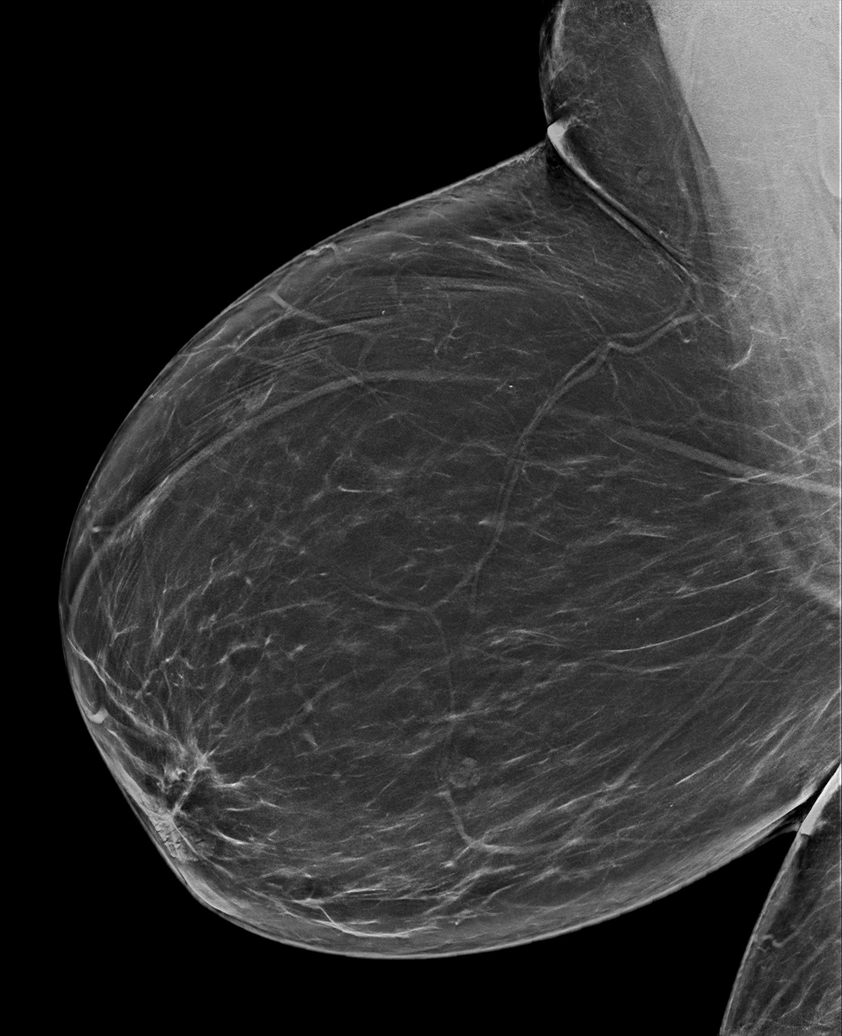

[L CC synth-2D]
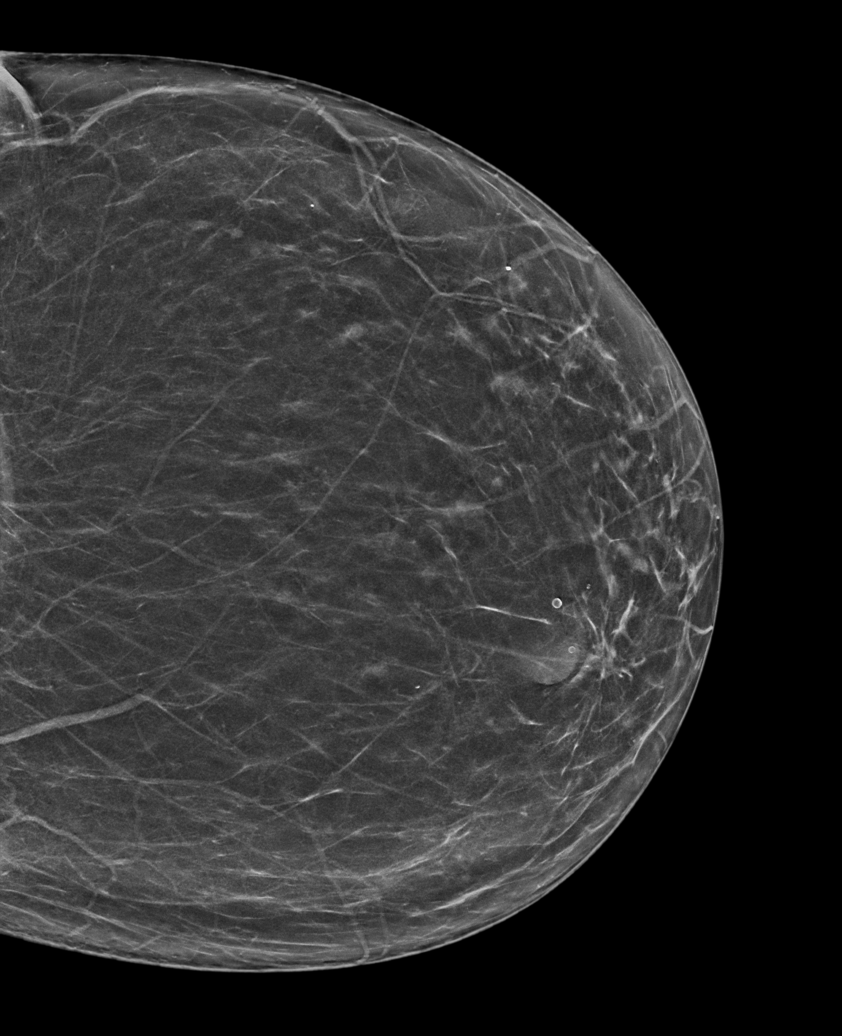

[R CC synth-2D]
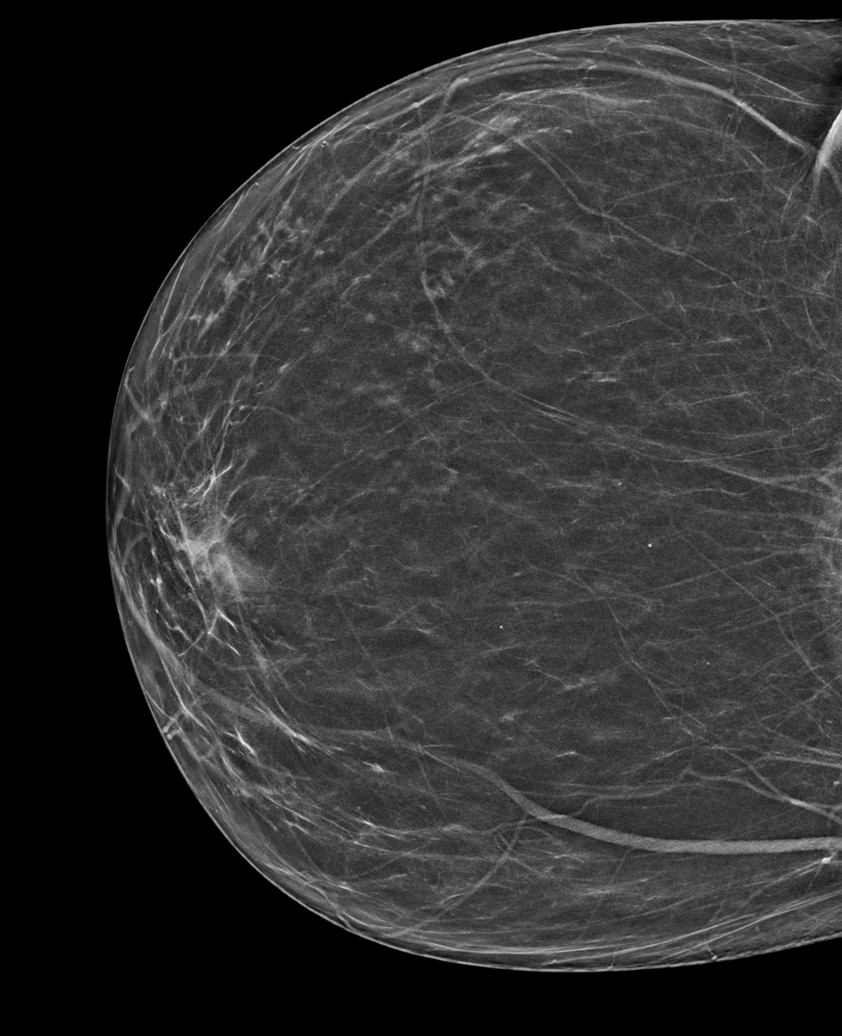

[R MLO synth-2D (2 of 2)]
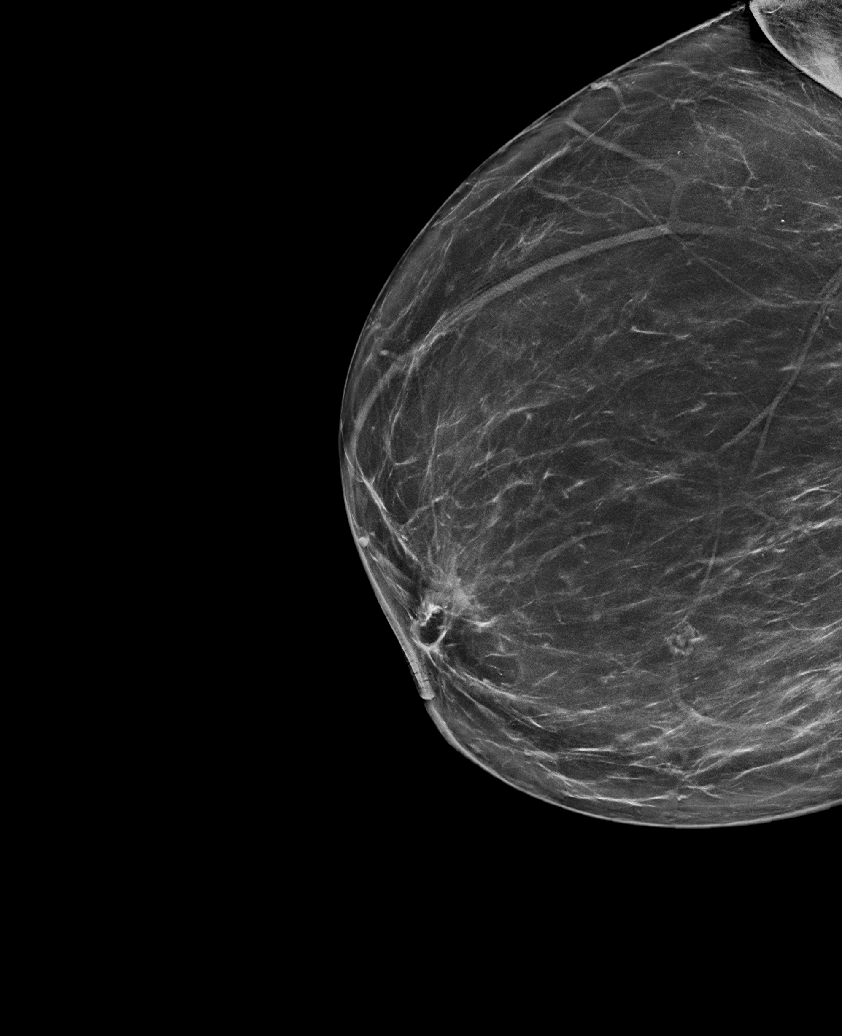

[L MLO synth-2D (1 of 2)]
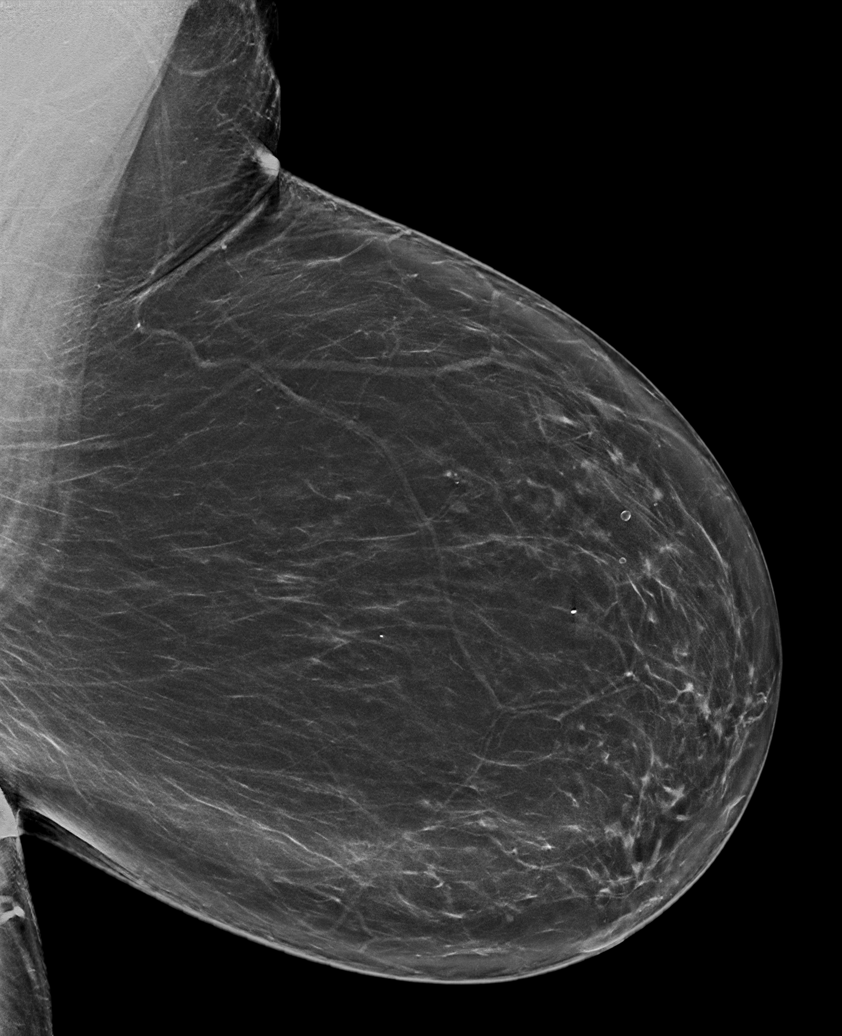

[L MLO synth-2D (2 of 2)]
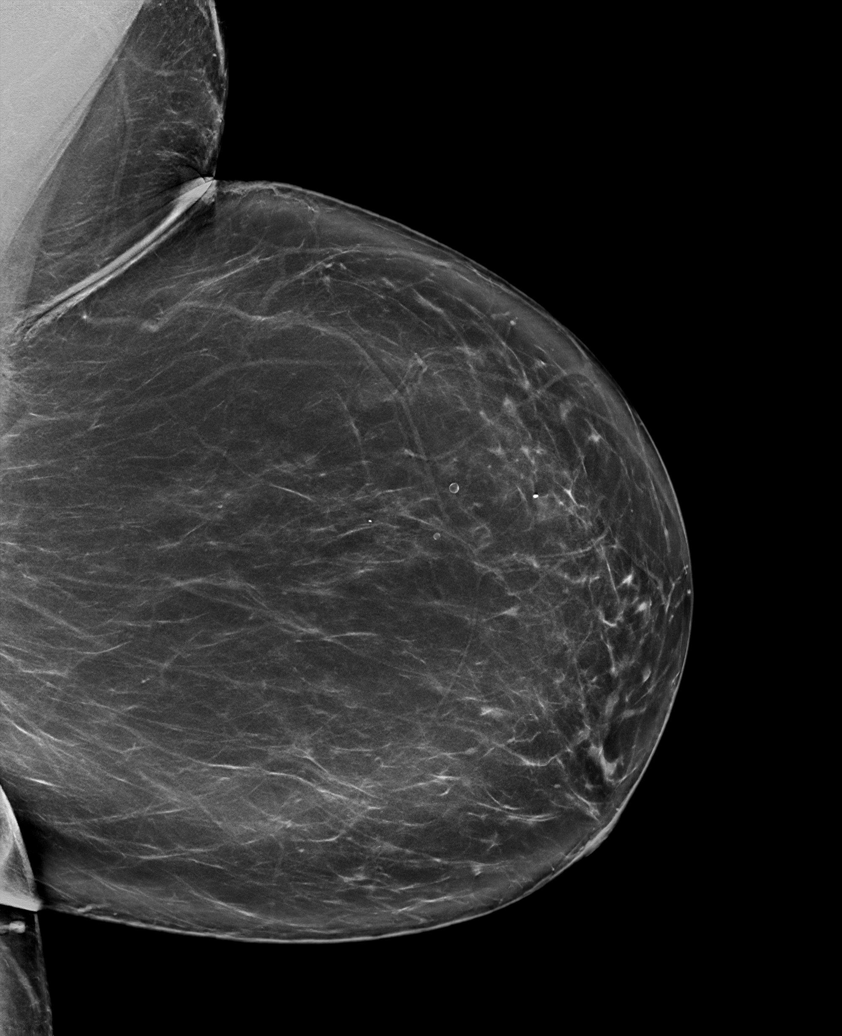

[6 of 36 positions shown; findings below may reference images not displayed]

ACR Breast Density Category b: There are scattered areas of
fibroglandular density.
FINDINGS: There are no findings suspicious for malignancy.
IMPRESSION: No mammographic evidence of malignancy. A result letter of this
screening mammogram will be mailed directly to the patient.

RECOMMENDATION:
Screening mammogram in one year. (Code:51-O-LD2)

BI-RADS CATEGORY  1: Negative.

## 2023-07-03 DIAGNOSIS — L918 Other hypertrophic disorders of the skin: Secondary | ICD-10-CM | POA: Diagnosis not present

## 2023-07-15 DIAGNOSIS — H5203 Hypermetropia, bilateral: Secondary | ICD-10-CM | POA: Diagnosis not present

## 2023-07-15 DIAGNOSIS — H2513 Age-related nuclear cataract, bilateral: Secondary | ICD-10-CM | POA: Diagnosis not present

## 2023-07-15 DIAGNOSIS — E119 Type 2 diabetes mellitus without complications: Secondary | ICD-10-CM | POA: Diagnosis not present

## 2023-07-17 ENCOUNTER — Ambulatory Visit
Admission: RE | Admit: 2023-07-17 | Discharge: 2023-07-17 | Disposition: A | Discharge status: Home / Self Care | Payer: Medicare HMO | Source: Ambulatory Visit | Attending: Family Medicine | Admitting: Family Medicine

## 2023-07-17 DIAGNOSIS — Z1231 Encounter for screening mammogram for malignant neoplasm of breast: Secondary | ICD-10-CM | POA: Diagnosis not present

## 2023-07-17 DIAGNOSIS — E1169 Type 2 diabetes mellitus with other specified complication: Secondary | ICD-10-CM | POA: Diagnosis not present

## 2023-07-17 DIAGNOSIS — D649 Anemia, unspecified: Secondary | ICD-10-CM | POA: Diagnosis not present

## 2023-07-17 DIAGNOSIS — E785 Hyperlipidemia, unspecified: Secondary | ICD-10-CM | POA: Diagnosis not present

## 2023-07-17 DIAGNOSIS — I10 Essential (primary) hypertension: Secondary | ICD-10-CM | POA: Diagnosis not present

## 2023-07-17 DIAGNOSIS — N289 Disorder of kidney and ureter, unspecified: Secondary | ICD-10-CM | POA: Diagnosis not present

## 2023-07-17 DIAGNOSIS — Z6841 Body Mass Index (BMI) 40.0 and over, adult: Secondary | ICD-10-CM | POA: Diagnosis not present

## 2023-07-24 DIAGNOSIS — I129 Hypertensive chronic kidney disease with stage 1 through stage 4 chronic kidney disease, or unspecified chronic kidney disease: Secondary | ICD-10-CM | POA: Diagnosis not present

## 2023-07-24 DIAGNOSIS — E785 Hyperlipidemia, unspecified: Secondary | ICD-10-CM | POA: Diagnosis not present

## 2023-07-24 DIAGNOSIS — Z780 Asymptomatic menopausal state: Secondary | ICD-10-CM | POA: Diagnosis not present

## 2023-07-24 DIAGNOSIS — N289 Disorder of kidney and ureter, unspecified: Secondary | ICD-10-CM | POA: Diagnosis not present

## 2023-07-24 DIAGNOSIS — E1122 Type 2 diabetes mellitus with diabetic chronic kidney disease: Secondary | ICD-10-CM | POA: Diagnosis not present

## 2023-07-24 DIAGNOSIS — Z6841 Body Mass Index (BMI) 40.0 and over, adult: Secondary | ICD-10-CM | POA: Diagnosis not present

## 2023-09-10 DIAGNOSIS — H25813 Combined forms of age-related cataract, bilateral: Secondary | ICD-10-CM | POA: Diagnosis not present

## 2023-09-10 DIAGNOSIS — R112 Nausea with vomiting, unspecified: Secondary | ICD-10-CM | POA: Diagnosis not present

## 2024-02-26 DIAGNOSIS — I10 Essential (primary) hypertension: Secondary | ICD-10-CM | POA: Diagnosis not present

## 2024-02-26 DIAGNOSIS — E785 Hyperlipidemia, unspecified: Secondary | ICD-10-CM | POA: Diagnosis not present

## 2024-02-26 DIAGNOSIS — E1169 Type 2 diabetes mellitus with other specified complication: Secondary | ICD-10-CM | POA: Diagnosis not present

## 2024-03-07 DIAGNOSIS — I10 Essential (primary) hypertension: Secondary | ICD-10-CM | POA: Diagnosis not present

## 2024-03-07 DIAGNOSIS — Z6841 Body Mass Index (BMI) 40.0 and over, adult: Secondary | ICD-10-CM | POA: Diagnosis not present

## 2024-03-07 DIAGNOSIS — E119 Type 2 diabetes mellitus without complications: Secondary | ICD-10-CM | POA: Diagnosis not present

## 2024-06-03 DIAGNOSIS — I10 Essential (primary) hypertension: Secondary | ICD-10-CM | POA: Diagnosis not present

## 2024-06-03 DIAGNOSIS — E1169 Type 2 diabetes mellitus with other specified complication: Secondary | ICD-10-CM | POA: Diagnosis not present

## 2024-06-03 DIAGNOSIS — E785 Hyperlipidemia, unspecified: Secondary | ICD-10-CM | POA: Diagnosis not present

## 2024-06-10 ENCOUNTER — Other Ambulatory Visit: Payer: Self-pay | Admitting: Family Medicine

## 2024-06-10 DIAGNOSIS — Z0000 Encounter for general adult medical examination without abnormal findings: Secondary | ICD-10-CM | POA: Diagnosis not present

## 2024-06-10 DIAGNOSIS — E1122 Type 2 diabetes mellitus with diabetic chronic kidney disease: Secondary | ICD-10-CM | POA: Diagnosis not present

## 2024-06-10 DIAGNOSIS — E7800 Pure hypercholesterolemia, unspecified: Secondary | ICD-10-CM | POA: Diagnosis not present

## 2024-06-10 DIAGNOSIS — Z1231 Encounter for screening mammogram for malignant neoplasm of breast: Secondary | ICD-10-CM

## 2024-06-10 DIAGNOSIS — I129 Hypertensive chronic kidney disease with stage 1 through stage 4 chronic kidney disease, or unspecified chronic kidney disease: Secondary | ICD-10-CM | POA: Diagnosis not present

## 2024-06-10 DIAGNOSIS — Z6841 Body Mass Index (BMI) 40.0 and over, adult: Secondary | ICD-10-CM | POA: Diagnosis not present

## 2024-06-10 DIAGNOSIS — N1832 Chronic kidney disease, stage 3b: Secondary | ICD-10-CM | POA: Diagnosis not present

## 2024-06-22 DIAGNOSIS — K922 Gastrointestinal hemorrhage, unspecified: Secondary | ICD-10-CM | POA: Diagnosis not present

## 2024-06-22 DIAGNOSIS — K3189 Other diseases of stomach and duodenum: Secondary | ICD-10-CM | POA: Diagnosis not present

## 2024-06-22 DIAGNOSIS — D175 Benign lipomatous neoplasm of intra-abdominal organs: Secondary | ICD-10-CM | POA: Diagnosis not present

## 2024-06-22 DIAGNOSIS — Z8719 Personal history of other diseases of the digestive system: Secondary | ICD-10-CM | POA: Diagnosis not present

## 2024-07-18 ENCOUNTER — Ambulatory Visit
Admission: RE | Admit: 2024-07-18 | Discharge: 2024-07-18 | Disposition: A | Source: Ambulatory Visit | Attending: Family Medicine | Admitting: Family Medicine

## 2024-07-18 DIAGNOSIS — Z1231 Encounter for screening mammogram for malignant neoplasm of breast: Secondary | ICD-10-CM
